# Patient Record
Sex: Female | Born: 1990
Health system: Southern US, Community
[De-identification: ages and names within clinical notes are randomized; demographics above are authoritative.]

## PROBLEM LIST (undated history)

## (undated) DIAGNOSIS — G8929 Other chronic pain: Secondary | ICD-10-CM

## (undated) DIAGNOSIS — F32A Depression, unspecified: Secondary | ICD-10-CM

## (undated) DIAGNOSIS — F419 Anxiety disorder, unspecified: Secondary | ICD-10-CM

## (undated) DIAGNOSIS — M5416 Radiculopathy, lumbar region: Secondary | ICD-10-CM

## (undated) HISTORY — PX: WISDOM TOOTH EXTRACTION: SHX21

## (undated) HISTORY — DX: Radiculopathy, lumbar region: M54.16

## (undated) HISTORY — PX: TONSILLECTOMY: SUR1361

---

## 2013-05-27 DIAGNOSIS — R000 Tachycardia, unspecified: Secondary | ICD-10-CM | POA: Insufficient documentation

## 2016-09-29 ENCOUNTER — Encounter: Payer: Self-pay | Admitting: Emergency Medicine

## 2016-09-29 ENCOUNTER — Emergency Department
Admission: EM | Admit: 2016-09-29 | Discharge: 2016-09-29 | Disposition: A | Discharge status: Home / Self Care | Payer: 59 | Attending: Emergency Medicine | Admitting: Emergency Medicine

## 2016-09-29 DIAGNOSIS — Z79899 Other long term (current) drug therapy: Secondary | ICD-10-CM | POA: Diagnosis not present

## 2016-09-29 DIAGNOSIS — K0889 Other specified disorders of teeth and supporting structures: Secondary | ICD-10-CM | POA: Diagnosis present

## 2016-09-29 MED ORDER — NAPROXEN 500 MG PO TABS
500.0000 mg | ORAL_TABLET | Freq: Once | ORAL | Status: AC
  Administered 2016-09-29: 500 mg via ORAL
  Filled 2016-09-29: qty 1

## 2016-09-29 MED ORDER — AMOXICILLIN 500 MG PO TABS: 500.0000 mg | ORAL_TABLET | Freq: Three times a day (TID) | ORAL | 0 refills | Status: AC

## 2016-09-29 MED ORDER — NAPROXEN 500 MG PO TBEC: 500.0000 mg | DELAYED_RELEASE_TABLET | Freq: Two times a day (BID) | ORAL | 0 refills | Status: DC

## 2016-09-29 NOTE — ED Notes (Signed)
See triage note  States she developed severe right jaw pain about 45 mins PTA  States developed pain about 1 hours after eating and while on the phone at work  States pain is at entire right jaw area

## 2016-09-29 NOTE — ED Triage Notes (Signed)
Pt c/o right jaw/dental pain. Thinks may have abscess but not sure. Pain started today. When takes breath in pain is worse when air hits mouth. Ambulatory to triage without distress. No fevers.

## 2016-09-29 NOTE — ED Provider Notes (Signed)
[facility] Emergency Department Provider Note  ____________________________________________  Time seen: Approximately 3:21 PM  I have reviewed the triage vital signs and the nursing notes.   HISTORY  Chief Complaint Jaw Pain    HPI Colleen Cook is a 26 y.o. female presenting to the emergency department with acute right lower jaw pain that started approximately 45 minutes ago. Patient states that she was on the phone at work, when she suddenly felt a sharp 10 out of 10 throbbing pain affecting her right lower jaw. Patient denies trauma to the right lower jaw. Patient is unable to identify a particular tooth as a source for her pain. Patient denies a history of dental abscesses. Patient denies otalgia and a history of TMJ pain. She has been afebrile. She has noticed no gingival hypertrophy, edema or fever. Patient states that she takes oxycodone daily for low back pain. No alleviating measures have been attempted. Patient denies chest pain, chest tightness, shortness of breath, abdominal pain, nausea and vomiting.    History reviewed. No pertinent past medical history.  There are no active problems to display for this patient.   Past Surgical History:  Procedure Laterality Date  . TONSILLECTOMY      Prior to Admission medications   Medication Sig Start Date End Date Taking? Authorizing Provider  hydrOXYzine (ATARAX/VISTARIL) 25 MG tablet Take 25 mg by mouth 3 (three) times daily as needed.   Yes Historical Provider, MD  oxyCODONE (OXY IR/ROXICODONE) 5 MG immediate release tablet Take 5 mg by mouth every 4 (four) hours as needed for severe pain.   Yes Historical Provider, MD  sertraline (ZOLOFT) 50 MG tablet Take 50 mg by mouth daily.   Yes Historical Provider, MD    Allergies Patient has no known allergies.  History reviewed. No pertinent family history.  Social History Social History  Substance Use Topics  . Smoking status: Never Smoker   . Smokeless tobacco: Never Used  . Alcohol use No     Review of Systems  Constitutional: No fever/chills Eyes: No visual changes. No discharge ENT: Patient has right lower jaw pain.  Cardiovascular: no chest pain. Respiratory: no cough. No SOB. Gastrointestinal: No abdominal pain. No nausea, no vomiting. No diarrhea.  No constipation. Musculoskeletal: Negative for musculoskeletal pain. Skin: Negative for rash, abrasions, lacerations, ecchymosis. Neurological: Negative for headaches, focal weakness or numbness.  ____________________________________________   PHYSICAL EXAM:  VITAL SIGNS: ED Triage Vitals [09/29/16 1418]  Enc Vitals Group     BP (!) 156/96     Pulse Rate (!) 110     Resp 20     Temp 98.2 F (36.8 C)     Temp Source Oral     SpO2 98 %     Weight 275 lb (124.7 kg)     Height 5\' 8"  (1.727 m)     Head Circumference      Peak Flow      Pain Score 7     Pain Loc      Pain Edu?      Excl. in GC?     Constitutional: Alert and oriented. Patient is talkative and engaged.  Eyes: Palpebral and bulbar conjunctiva are nonerythematous bilaterally. PERRL. EOMI. Head: Atraumatic. ENT:      Ears: Tympanic membranes are pearly bilaterally without effusion, erythema or purulent exudate. Bony landmarks are visualized bilaterally.       Mouth/Throat: Mucous membranes are moist. Posterior pharynx is nonerythematous. Patient has no dental caries visualized. There  is no increased gingival hypertrophy or edema. Neck: Full range of motion. No pain with neck flexion. Hematological/Lymphatic/Immunilogical: No cervical lymphadenopathy.  Cardiovascular: Mildly tachycardic. Normal S1 and S2. No murmurs, gallops or rubs auscultated.  Respiratory:  On auscultation, adventitious sounds are absent.  Neurologic:  Normal for age. No gross focal neurologic deficits are appreciated.  Musculoskeletal: No pain is elicited with TMJ testing. Skin:  Skin overlying the jaw is without edema or  erythema. Psychiatric: Mood and affect are normal for age. Speech and behavior are normal.   ____________________________________________   LABS (all labs ordered are listed, but only abnormal results are displayed)  Labs Reviewed - No data to display ____________________________________________  EKG   ____________________________________________  RADIOLOGY  No results found.  ____________________________________________    PROCEDURES  Procedure(s) performed:    Procedures    Medications - No data to display   ____________________________________________   INITIAL IMPRESSION / ASSESSMENT AND PLAN / ED COURSE  Pertinent labs & imaging results that were available during my care of the patient were reviewed by me and considered in my medical decision making (see chart for details).  Review of the [place] CSRS was performed in accordance of the [facility] prior to dispensing any controlled drugs.     Assessment and Plan: Pain, dental Patient presents to the emergency department with acute right lower jaw pain. Physical exam is reassuring with no signs of focal edema, erythema or gingival hypertrophy. No dental caries were visualized. Patient was started empirically on amoxicillin for an early dental abscess. Patient was advised to seek care with a dentist immediately. Patient was discharged with naproxen. All patient questions were answered.   ____________________________________________  FINAL CLINICAL IMPRESSION(S) / ED DIAGNOSES  Final diagnoses:  None      NEW MEDICATIONS STARTED DURING THIS VISIT:  New Prescriptions   No medications on file        This chart was dictated using voice recognition software/Dragon. Despite best efforts to proofread, errors can occur which can change the meaning. Any change was purely unintentional.    Orvil Feil, PA-C 09/29/16 2353    Phineas Semen, MD 09/30/16 [phone]

## 2017-02-26 ENCOUNTER — Telehealth: Payer: Self-pay

## 2017-02-26 NOTE — Telephone Encounter (Signed)
Patient is requesting to Establish Care. She is currently taking Sertraline, Clonazepam and Ibuprofen. She has a medical history of depression, anxiety, lumbar neuropathy and a bulging disc. She has [employer] for insurance. Please advise.  ZO#[phone]

## 2017-03-03 NOTE — Telephone Encounter (Signed)
Left patient a message to call back to schedule a new patient appointment with Ricki Rodriguezdriana if still interested. Okay per Ricki RodriguezAdriana.

## 2017-03-31 ENCOUNTER — Ambulatory Visit (INDEPENDENT_AMBULATORY_CARE_PROVIDER_SITE_OTHER): Payer: 59 | Admitting: Physician Assistant

## 2017-03-31 ENCOUNTER — Encounter: Payer: Self-pay | Admitting: Physician Assistant

## 2017-03-31 VITALS — BP 118/68 | HR 92 | Temp 98.4°F | Resp 16 | Ht 68.0 in | Wt 272.0 lb

## 2017-03-31 DIAGNOSIS — M5416 Radiculopathy, lumbar region: Secondary | ICD-10-CM

## 2017-03-31 DIAGNOSIS — Z7689 Persons encountering health services in other specified circumstances: Secondary | ICD-10-CM | POA: Diagnosis not present

## 2017-03-31 NOTE — Progress Notes (Signed)
Patient: Colleen Cook Female    DOB: 07/17/1991   26 y.o.   MRN: [id number] Visit Date: 03/31/2017  Today's Provider: Trey Sailors, PA-C   Chief Complaint  Patient presents with  . Establish Care    Recently moved from [facility].   . Back Pain    Needs referral    Subjective:      Establishing care today. Moved from [place] to [place]. Used to see Dr. Luis Abed at [facility].   Has history of lumbar radiculopathy, previously treated at [facility] with epidural steroid injections. Also previously on oxycodone. Would like to be seen closer to [place]. Previously did eight months PT and continues to do exercises at home.    Back Pain  This is a chronic problem. The current episode started more than 1 year ago. The problem occurs constantly. The problem has been gradually worsening since onset. The pain is present in the lumbar spine. The pain radiates to the left thigh. The pain is at a severity of 8/10. The pain is the same all the time. The symptoms are aggravated by position, standing and sitting. Pertinent negatives include no abdominal pain, bladder incontinence, bowel incontinence, chest pain, dysuria, headaches, leg pain, numbness, paresis, paresthesias, pelvic pain, perianal numbness, tingling, weakness or weight loss.    No Known Allergies   Current Outpatient Prescriptions:  .  ibuprofen (ADVIL,MOTRIN) 800 MG tablet, , Disp: , Rfl:  .  sertraline (ZOLOFT) 50 MG tablet, Take 50 mg by mouth daily., Disp: , Rfl:  .  clonazePAM (KLONOPIN) 0.5 MG tablet, Take 0.5 mg by mouth 2 (two) times daily as needed., Disp: , Rfl: 1 .  oxyCODONE (OXY IR/ROXICODONE) 5 MG immediate release tablet, Take 5 mg by mouth every 4 (four) hours as needed for severe pain., Disp: , Rfl:   Review of Systems  Constitutional: Negative.  Negative for weight loss.  HENT: Negative.   Eyes: Negative.   Respiratory: Negative.     Cardiovascular: Negative.  Negative for chest pain.  Gastrointestinal: Negative.  Negative for abdominal pain and bowel incontinence.  Endocrine: Negative.   Genitourinary: Positive for vaginal pain (Random "sharp pain" that lasts about 30 mins. ). Negative for bladder incontinence, decreased urine volume, difficulty urinating, dyspareunia, dysuria, enuresis, flank pain, frequency, genital sores, hematuria, menstrual problem, pelvic pain, urgency, vaginal bleeding and vaginal discharge.  Musculoskeletal: Positive for back pain.  Skin: Negative.   Allergic/Immunologic: Negative.   Neurological: Negative.  Negative for tingling, weakness, numbness, headaches and paresthesias.  Hematological: Negative.   Psychiatric/Behavioral: Negative for agitation, behavioral problems, confusion, decreased concentration, dysphoric mood, hallucinations, self-injury, sleep disturbance and suicidal ideas. The patient is nervous/anxious. The patient is not hyperactive.     Social History  Substance Use Topics  . Smoking status: Never Smoker  . Smokeless tobacco: Never Used  . Alcohol use No   Objective:   BP 118/68 (BP Location: Right Arm, Patient Position: Sitting, Cuff Size: Normal)   Pulse 92   Temp 98.4 F (36.9 C) (Oral)   Resp 16   Ht 5\' 8"  (1.727 m)   Wt 272 lb (123.4 kg)   BMI 41.36 kg/m  Vitals:   03/31/17 1013  BP: 118/68  Pulse: 92  Resp: 16  Temp: 98.4 F (36.9 C)  TempSrc: Oral  Weight: 272 lb (123.4 kg)  Height: 5\' 8"  (1.727 m)     Physical Exam  Constitutional: She is oriented  to person, place, and time. She appears well-developed and well-nourished.  Cardiovascular: Normal rate and regular rhythm.   Pulmonary/Chest: Effort normal and breath sounds normal.  Abdominal: Soft. Bowel sounds are normal.  Neurological: She is alert and oriented to person, place, and time.  Skin: Skin is warm and dry.  Psychiatric: She has a normal mood and affect. Her behavior is normal.         Assessment & Plan:     1. Encounter to establish care  Will need to request most recent PAP, vaccinations, treatment notes from prior PCP. Do see visits in [facility] with [facility] with injections. Will refer to Dr. Cherylann Ratel in pain clinic for further injections. Can refer back to [facility] if patient changes her mind.  - Ambulatory referral to Pain Clinic  Return in about 4 weeks (around 04/28/2017) for CPE.  The entirety of the information documented in the History of Present Illness, Review of Systems and Physical Exam were personally obtained by me. Portions of this information were initially documented by Kavin Leech, CMA and reviewed by me for thoroughness and accuracy.        Trey Sailors, PA-C  [facility]

## 2017-03-31 NOTE — Patient Instructions (Signed)
Sciatica Sciatica is pain, numbness, weakness, or tingling along your sciatic nerve. The sciatic nerve starts in the lower back and goes down the back of each leg. Sciatica happens when this nerve is pinched or has pressure put on it. Sciatica usually goes away on its own or with treatment. Sometimes, sciatica may keep coming back (recur). Follow these instructions at home: Medicines  Take over-the-counter and prescription medicines only as told by your doctor.  Do not drive or use heavy machinery while taking prescription pain medicine. Managing pain  If directed, put ice on the affected area. ? Put ice in a plastic bag. ? Place a towel between your skin and the bag. ? Leave the ice on for 20 minutes, 2-3 times a day.  After icing, apply heat to the affected area before you exercise or as often as told by your doctor. Use the heat source that your doctor tells you to use, such as a moist heat pack or a heating pad. ? Place a towel between your skin and the heat source. ? Leave the heat on for 20-30 minutes. ? Remove the heat if your skin turns bright red. This is especially important if you are unable to feel pain, heat, or cold. You may have a greater risk of getting burned. Activity  Return to your normal activities as told by your doctor. Ask your doctor what activities are safe for you. ? Avoid activities that make your sciatica worse.  Take short rests during the day. Rest in a lying or standing position. This is usually better than sitting to rest. ? When you rest for a long time, do some physical activity or stretching between periods of rest. ? Avoid sitting for a long time without moving. Get up and move around at least one time each hour.  Exercise and stretch regularly, as told by your doctor.  Do not lift anything that is heavier than 10 lb (4.5 kg) while you have symptoms of sciatica. ? Avoid lifting heavy things even when you do not have symptoms. ? Avoid lifting heavy  things over and over.  When you lift objects, always lift in a way that is safe for your body. To do this, you should: ? Bend your knees. ? Keep the object close to your body. ? Avoid twisting. General instructions  Use good posture. ? Avoid leaning forward when you are sitting. ? Avoid hunching over when you are standing.  Stay at a healthy weight.  Wear comfortable shoes that support your feet. Avoid wearing high heels.  Avoid sleeping on a mattress that is too soft or too hard. You might have less pain if you sleep on a mattress that is firm enough to support your back.  Keep all follow-up visits as told by your doctor. This is important. Contact a doctor if:  You have pain that: ? Wakes you up when you are sleeping. ? Gets worse when you lie down. ? Is worse than the pain you have had in the past. ? Lasts longer than 4 weeks.  You lose weight for without trying. Get help right away if:  You cannot control when you pee (urinate) or poop (have a bowel movement).  You have weakness in any of these areas and it gets worse. ? Lower back. ? Lower belly (pelvis). ? Butt (buttocks). ? Legs.  You have redness or swelling of your back.  You have a burning feeling when you pee. This information is not intended to replace   advice given to you by your health care provider. Make sure you discuss any questions you have with your health care provider. Document Released: 05/06/2008 Document Revised: 01/03/2016 Document Reviewed: 04/06/2015 Elsevier Interactive Patient Education  2018 [employer]  

## 2017-04-14 ENCOUNTER — Ambulatory Visit
Payer: 59 | Attending: Student in an Organized Health Care Education/Training Program | Admitting: Student in an Organized Health Care Education/Training Program

## 2017-04-14 ENCOUNTER — Encounter: Payer: Self-pay | Admitting: Student in an Organized Health Care Education/Training Program

## 2017-04-14 VITALS — BP 160/98 | HR 110 | Temp 98.9°F | Resp 18 | Ht 68.0 in | Wt 265.0 lb

## 2017-04-14 DIAGNOSIS — Z79899 Other long term (current) drug therapy: Secondary | ICD-10-CM | POA: Diagnosis not present

## 2017-04-14 DIAGNOSIS — F4310 Post-traumatic stress disorder, unspecified: Secondary | ICD-10-CM | POA: Insufficient documentation

## 2017-04-14 DIAGNOSIS — M5416 Radiculopathy, lumbar region: Secondary | ICD-10-CM | POA: Diagnosis not present

## 2017-04-14 DIAGNOSIS — F909 Attention-deficit hyperactivity disorder, unspecified type: Secondary | ICD-10-CM | POA: Diagnosis not present

## 2017-04-14 DIAGNOSIS — F410 Panic disorder [episodic paroxysmal anxiety] without agoraphobia: Secondary | ICD-10-CM | POA: Diagnosis not present

## 2017-04-14 DIAGNOSIS — Z6841 Body Mass Index (BMI) 40.0 and over, adult: Secondary | ICD-10-CM | POA: Diagnosis not present

## 2017-04-14 DIAGNOSIS — M4726 Other spondylosis with radiculopathy, lumbar region: Secondary | ICD-10-CM | POA: Diagnosis not present

## 2017-04-14 DIAGNOSIS — F411 Generalized anxiety disorder: Secondary | ICD-10-CM | POA: Diagnosis not present

## 2017-04-14 DIAGNOSIS — F331 Major depressive disorder, recurrent, moderate: Secondary | ICD-10-CM | POA: Insufficient documentation

## 2017-04-14 DIAGNOSIS — G894 Chronic pain syndrome: Secondary | ICD-10-CM | POA: Insufficient documentation

## 2017-04-14 DIAGNOSIS — M47816 Spondylosis without myelopathy or radiculopathy, lumbar region: Secondary | ICD-10-CM | POA: Diagnosis not present

## 2017-04-14 MED ORDER — TIZANIDINE HCL 4 MG PO TABS: 4.0000 mg | ORAL_TABLET | Freq: Four times a day (QID) | ORAL | 0 refills | Status: DC | PRN

## 2017-04-14 MED ORDER — TOPIRAMATE 25 MG PO TABS: 25.0000 mg | ORAL_TABLET | Freq: Two times a day (BID) | ORAL | 1 refills | Status: DC

## 2017-04-14 NOTE — Patient Instructions (Addendum)
It was nice meeting you today. Today we discussed the following:  1. Urine drug screen today. 2. Start Topamax 25 mg twice a day. 3. Tizanidine 4 mg up to 3 times a day as needed for muscle spasms 4. Schedule lumbar epidural steroid injection 5. As we discussed, free to be considered for opioid therapy at our clinic, you must be off of benzodiazepines such as Klonopin. Try using the tizanidine which may also help out with anxiety. Have a discussion about this with her psychiatrist. Follow-up with me in approximately 3 weeks for medication management, next available for epidural steroid injection.Pain Management Discharge Instructions  General Discharge Instructions :  If you need to reach your doctor call: Monday-Friday 8:00 am - 4:00 pm at [phone] or toll free [phone].  After clinic hours [phone] to have operator reach doctor.  Bring all of your medication bottles to all your appointments in the pain clinic.  To cancel or reschedule your appointment with Pain Management please remember to call 24 hours in advance to avoid a fee.  Refer to the educational materials which you have been given on: General Risks, I had my Procedure. Discharge Instructions, Post Sedation.  Post Procedure Instructions:  The drugs you were given will stay in your system until tomorrow, so for the next 24 hours you should not drive, make any legal decisions or drink any alcoholic beverages.  You may eat anything you prefer, but it is better to start with liquids then soups and crackers, and gradually work up to solid foods.  Please notify your doctor immediately if you have any unusual bleeding, trouble breathing or pain that is not related to your normal pain.  Depending on the type of procedure that was done, some parts of your body may feel week and/or numb.  This usually clears up by tonight or the next day.  Walk with the use of an assistive device or accompanied by an adult for the 24  hours.  You may use ice on the affected area for the first 24 hours.  Put ice in a Ziploc bag and cover with a towel and place against area 15 minutes on 15 minutes off.  You may switch to heat after 24 hours.GENERAL RISKS AND COMPLICATIONS  What are the risk, side effects and possible complications? Generally speaking, most procedures are safe.  However, with any procedure there are risks, side effects, and the possibility of complications.  The risks and complications are dependent upon the sites that are lesioned, or the type of nerve block to be performed.  The closer the procedure is to the spine, the more serious the risks are.  Great care is taken when placing the radio frequency needles, block needles or lesioning probes, but sometimes complications can occur. 1. Infection: Any time there is an injection through the skin, there is a risk of infection.  This is why sterile conditions are used for these blocks.  There are four possible types of infection. 1. Localized skin infection. 2. Central Nervous System Infection-This can be in the form of Meningitis, which can be deadly. 3. Epidural Infections-This can be in the form of an epidural abscess, which can cause pressure inside of the spine, causing compression of the spinal cord with subsequent paralysis. This would require an emergency surgery to decompress, and there are no guarantees that the patient would recover from the paralysis. 4. Discitis-This is an infection of the intervertebral discs.  It occurs in about 1% of discography procedures.  It is difficult to treat and it may lead to surgery.        2. Pain: the needles have to go through skin and soft tissues, will cause soreness.       3. Damage to internal structures:  The nerves to be lesioned may be near blood vessels or    other nerves which can be potentially damaged.       4. Bleeding: Bleeding is more common if the patient is taking blood thinners such as  aspirin, Coumadin,  Ticiid, Plavix, etc., or if he/she have some genetic predisposition  such as hemophilia. Bleeding into the spinal canal can cause compression of the spinal  cord with subsequent paralysis.  This would require an emergency surgery to  decompress and there are no guarantees that the patient would recover from the  paralysis.       5. Pneumothorax:  Puncturing of a lung is a possibility, every time a needle is introduced in  the area of the chest or upper back.  Pneumothorax refers to free air around the  collapsed lung(s), inside of the thoracic cavity (chest cavity).  Another two possible  complications related to a similar event would include: Hemothorax and Chylothorax.   These are variations of the Pneumothorax, where instead of air around the collapsed  lung(s), you may have blood or chyle, respectively.       6. Spinal headaches: They may occur with any procedures in the area of the spine.       7. Persistent CSF (Cerebro-Spinal Fluid) leakage: This is a rare problem, but may occur  with prolonged intrathecal or epidural catheters either due to the formation of a fistulous  track or a dural tear.       8. Nerve damage: By working so close to the spinal cord, there is always a possibility of  nerve damage, which could be as serious as a permanent spinal cord injury with  paralysis.       9. Death:  Although rare, severe deadly allergic reactions known as "Anaphylactic  reaction" can occur to any of the medications used.      10. Worsening of the symptoms:  We can always make thing worse.  What are the chances of something like this happening? Chances of any of this occuring are extremely low.  By statistics, you have more of a chance of getting killed in a motor vehicle accident: while driving to the hospital than any of the above occurring .  Nevertheless, you should be aware that they are possibilities.  In general, it is similar to taking a shower.  Everybody knows that you can slip, hit your head and  get killed.  Does that mean that you should not shower again?  Nevertheless always keep in mind that statistics do not mean anything if you happen to be on the wrong side of them.  Even if a procedure has a 1 (one) in a 1,000,000 (million) chance of going wrong, it you happen to be that one..Also, keep in mind that by statistics, you have more of a chance of having something go wrong when taking medications.  Who should not have this procedure? If you are on a blood thinning medication (e.g. Coumadin, Plavix, see list of "Blood Thinners"), or if you have an active infection going on, you should not have the procedure.  If you are taking any blood thinners, please inform your physician.  How should I prepare for this procedure?  Do not eat or drink anything at least six hours prior to the procedure.  Bring a driver with you .  It cannot be a taxi.  Come accompanied by an adult that can drive you back, and that is strong enough to help you if your legs get weak or numb from the local anesthetic.  Take all of your medicines the morning of the procedure with just enough water to swallow them.  If you have diabetes, make sure that you are scheduled to have your procedure done first thing in the morning, whenever possible.  If you have diabetes, take only half of your insulin dose and notify our nurse that you have done so as soon as you arrive at the clinic.  If you are diabetic, but only take blood sugar pills (oral hypoglycemic), then do not take them on the morning of your procedure.  You may take them after you have had the procedure.  Do not take aspirin or any aspirin-containing medications, at least eleven (11) days prior to the procedure.  They may prolong bleeding.  Wear loose fitting clothing that may be easy to take off and that you would not mind if it got stained with Betadine or blood.  Do not wear any jewelry or perfume  Remove any nail coloring.  It will interfere with some of  our monitoring equipment.  NOTE: Remember that this is not meant to be interpreted as a complete list of all possible complications.  Unforeseen problems may occur.  BLOOD THINNERS The following drugs contain aspirin or other products, which can cause increased bleeding during surgery and should not be taken for 2 weeks prior to and 1 week after surgery.  If you should need take something for relief of minor pain, you may take acetaminophen which is found in Tylenol,m Datril, Anacin-3 and Panadol. It is not blood thinner. The products listed below are.  Do not take any of the products listed below in addition to any listed on your instruction sheet.  A.P.C or A.P.C with Codeine Codeine Phosphate Capsules #3 Ibuprofen Ridaura  ABC compound Congesprin Imuran rimadil  Advil Cope Indocin Robaxisal  Alka-Seltzer Effervescent Pain Reliever and Antacid Coricidin or Coricidin-D  Indomethacin Rufen  Alka-Seltzer plus Cold Medicine Cosprin Ketoprofen S-A-C Tablets  Anacin Analgesic Tablets or Capsules Coumadin Korlgesic Salflex  Anacin Extra Strength Analgesic tablets or capsules CP-2 Tablets Lanoril Salicylate  Anaprox Cuprimine Capsules Levenox Salocol  Anexsia-D Dalteparin Magan Salsalate  Anodynos Darvon compound Magnesium Salicylate Sine-off  Ansaid Dasin Capsules Magsal Sodium Salicylate  Anturane Depen Capsules Marnal Soma  APF Arthritis pain formula Dewitt's Pills Measurin Stanback  Argesic Dia-Gesic Meclofenamic Sulfinpyrazone  Arthritis Bayer Timed Release Aspirin Diclofenac Meclomen Sulindac  Arthritis pain formula Anacin Dicumarol Medipren Supac  Analgesic (Safety coated) Arthralgen Diffunasal Mefanamic Suprofen  Arthritis Strength Bufferin Dihydrocodeine Mepro Compound Suprol  Arthropan liquid Dopirydamole Methcarbomol with Aspirin Synalgos  ASA tablets/Enseals Disalcid Micrainin Tagament  Ascriptin Doan's Midol Talwin  Ascriptin A/D Dolene Mobidin Tanderil  Ascriptin Extra Strength  Dolobid Moblgesic Ticlid  Ascriptin with Codeine Doloprin or Doloprin with Codeine Momentum Tolectin  Asperbuf Duoprin Mono-gesic Trendar  Aspergum Duradyne Motrin or Motrin IB Triminicin  Aspirin plain, buffered or enteric coated Durasal Myochrisine Trigesic  Aspirin Suppositories Easprin Nalfon Trillsate  Aspirin with Codeine Ecotrin Regular or Extra Strength Naprosyn Uracel  Atromid-S Efficin Naproxen Ursinus  Auranofin Capsules Elmiron Neocylate Vanquish  Axotal Emagrin Norgesic Verin  Azathioprine Empirin or Empirin with Codeine Normiflo Vitamin E  Azolid Emprazil Nuprin Voltaren  Bayer Aspirin plain, buffered or children's or timed BC Tablets or powders Encaprin Orgaran Warfarin Sodium  Buff-a-Comp Enoxaparin Orudis Zorpin  Buff-a-Comp with Codeine Equegesic Os-Cal-Gesic   Buffaprin Excedrin plain, buffered or Extra Strength Oxalid   Bufferin Arthritis Strength Feldene Oxphenbutazone   Bufferin plain or Extra Strength Feldene Capsules Oxycodone with Aspirin   Bufferin with Codeine Fenoprofen Fenoprofen Pabalate or Pabalate-SF   Buffets II Flogesic Panagesic   Buffinol plain or Extra Strength Florinal or Florinal with Codeine Panwarfarin   Buf-Tabs Flurbiprofen Penicillamine   Butalbital Compound Four-way cold tablets Penicillin   Butazolidin Fragmin Pepto-Bismol   Carbenicillin Geminisyn Percodan   Carna Arthritis Reliever Geopen Persantine   Carprofen Gold's salt Persistin   Chloramphenicol Goody's Phenylbutazone   Chloromycetin Haltrain Piroxlcam   Clmetidine heparin Plaquenil   Cllnoril Hyco-pap Ponstel   Clofibrate Hydroxy chloroquine Propoxyphen         Before stopping any of these medications, be sure to consult the physician who ordered them.  Some, such as Coumadin (Warfarin) are ordered to prevent or treat serious conditions such as "deep thrombosis", "pumonary embolisms", and other heart problems.  The amount of time that you may need off of the medication may also  vary with the medication and the reason for which you were taking it.  If you are taking any of these medications, please make sure you notify your pain physician before you undergo any procedures.         Epidural Steroid Injection Patient Information  Description: The epidural space surrounds the nerves as they exit the spinal cord.  In some patients, the nerves can be compressed and inflamed by a bulging disc or a tight spinal canal (spinal stenosis).  By injecting steroids into the epidural space, we can bring irritated nerves into direct contact with a potentially helpful medication.  These steroids act directly on the irritated nerves and can reduce swelling and inflammation which often leads to decreased pain.  Epidural steroids may be injected anywhere along the spine and from the neck to the low back depending upon the location of your pain.   After numbing the skin with local anesthetic (like Novocaine), a small needle is passed into the epidural space slowly.  You may experience a sensation of pressure while this is being done.  The entire block usually last less than 10 minutes.  Conditions which may be treated by epidural steroids:   Low back and leg pain  Neck and arm pain  Spinal stenosis  Post-laminectomy syndrome  Herpes zoster (shingles) pain  Pain from compression fractures  Preparation for the injection:  1. Do not eat any solid food or dairy products within 8 hours of your appointment.  2. You may drink clear liquids up to 3 hours before appointment.  Clear liquids include water, black coffee, juice or soda.  No milk or cream please. 3. You may take your regular medication, including pain medications, with a sip of water before your appointment  Diabetics should hold regular insulin (if taken separately) and take 1/2 normal NPH dos the morning of the procedure.  Carry some sugar containing items with you to your appointment. 4. A driver must accompany you and  be prepared to drive you home after your procedure.  5. Bring all your current medications with your. 6. An IV may be inserted and sedation may be given at the discretion of the physician.   7. A blood pressure cuff, EKG and other  monitors will often be applied during the procedure.  Some patients may need to have extra oxygen administered for a short period. 8. You will be asked to provide medical information, including your allergies, prior to the procedure.  We must know immediately if you are taking blood thinners (like Coumadin/Warfarin)  Or if you are allergic to IV iodine contrast (dye). We must know if you could possible be pregnant.  Possible side-effects:  Bleeding from needle site  Infection (rare, may require surgery)  Nerve injury (rare)  Numbness & tingling (temporary)  Difficulty urinating (rare, temporary)  Spinal headache ( a headache worse with upright posture)  Light -headedness (temporary)  Pain at injection site (several days)  Decreased blood pressure (temporary)  Weakness in arm/leg (temporary)  Pressure sensation in back/neck (temporary)  Call if you experience:  Fever/chills associated with headache or increased back/neck pain.  Headache worsened by an upright position.  New onset weakness or numbness of an extremity below the injection site  Hives or difficulty breathing (go to the emergency room)  Inflammation or drainage at the infection site  Severe back/neck pain  Any new symptoms which are concerning to you  Please note:  Although the local anesthetic injected can often make your back or neck feel good for several hours after the injection, the pain will likely return.  It takes 3-7 days for steroids to work in the epidural space.  You may not notice any pain relief for at least that one week.  If effective, we will often do a series of three injections spaced 3-6 weeks apart to maximally decrease your pain.  After the initial series,  we generally will wait several months before considering a repeat injection of the same type.  If you have any questions, please call [phone] [facility]

## 2017-04-14 NOTE — Progress Notes (Signed)
Safety precautions to be maintained throughout the outpatient stay will include: orient to surroundings, keep bed in low position, maintain call bell within reach at all times, provide assistance with transfer out of bed and ambulation.  

## 2017-04-14 NOTE — Progress Notes (Signed)
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  . tiZANidine (ZANAFLEX) 4 MG tablet    Sig: Take 1 tablet (4 mg total) by mouth every 6 (six) hours as needed for muscle spasms.    Dispense:  30 tablet    Refill:  0   Medications administered during this visit: Ms. Fesler had no medications administered during this visit.   Pharmacological management options:  Opioid Analgesics: The patient was informed that there is no guarantee that she would be a candidate for opioid analgesics. The decision will be made following CDC guidelines. This decision will be based on the results of diagnostic studies, as well as Ms. Tsou's risk profile. Can consider Percocet 5 mg, quantity 60-90. Can also consider hydrocodone 7.5 mg   Membrane stabilizer: To be determined at a later time has tried gabapentin resulted in sedation. We'll trial Topamax today. Can consider Lyrica in the future   Muscle relaxant: To be determined at a later time we'll trial tizanidine today, can consider baclofen and Robaxin in the future. Tried Flexeril in the past.   NSAID: To be determined  at a later time trial of Mobic or diclofenac in the future.   Other analgesic(s): To be determined at a later time Voltaren gel, TENS, any PSYCHOTROPIC AGENTS per [facility]    Interventional management options: Ms. Siebers was informed that there is no guarantee that she would  be a candidate for interventional therapies. The decision will be based on the results of diagnostic studies, as well as Ms. Erck's risk profile.  Procedure(s) under consideration:  -Lumbar epidural steroid injection -Lumbar medial branch blocks with goal radiofrequency ablation (LEFT L3-L5) -Sacroiliac joint injection (left)   Provider-requested follow-up: Return in about 3 weeks (around 05/05/2017) for Procedure, Medication Management.  Future Appointments Date Time Provider [place]  05/05/2017 9:30 AM Gillis Santa, MD [facility] None    Primary Care Physician: Paulene Floor Location: [facility] Facility Note by: Gillis Santa, M.D, Date: 04/14/2017; Time: 11:05 AM  Patient Instructions   It was nice meeting you today. Today we discussed the following:  1. Urine drug screen today. 2. Start Topamax 25 mg twice a day. 3. Tizanidine 4 mg up to 3 times a day as needed for muscle spasms 4. Schedule lumbar epidural steroid injection 5. As we discussed, free to be considered for opioid therapy at our clinic, you must be off of benzodiazepines such as Klonopin. Try using the tizanidine which may also help out with anxiety. Have a discussion about this with her psychiatrist. Follow-up with me in approximately 3 weeks for medication management, next available for epidural steroid injection.Pain Management Discharge Instructions  General Discharge Instructions :  If you need to reach your doctor call: Monday-Friday 8:00 am - 4:00 pm at [phone] or toll free [phone].  After clinic hours [phone] to have operator reach doctor.  Bring all of your medication bottles  to all your appointments in the pain clinic.  To cancel or reschedule your appointment with Pain Management please remember to call 24 hours in advance to avoid a fee.  Refer to the educational materials which you have been given on: General Risks, I had my Procedure. Discharge Instructions, Post Sedation.  Post Procedure Instructions:  The drugs you were given will stay in your system until tomorrow, so for the next 24 hours you should not drive, make any legal decisions or drink any alcoholic beverages.  You may eat anything you prefer, but it is better to start with liquids then soups and crackers, and gradually work up to solid foods.  Please notify your doctor immediately if you have any unusual bleeding, trouble breathing or pain that is not related to your normal pain.  Depending on the type of procedure that was done, some parts of your body may feel week and/or numb.  This usually clears up by tonight or the next day.  Walk with the use of an assistive device or accompanied by an adult for the 24 hours.  You may use ice on the affected area for the first 24 hours.  Put ice in a Ziploc bag and cover with a towel and place against area 15 minutes on 15 minutes off.  You may switch to heat after 24 hours.GENERAL RISKS AND COMPLICATIONS  What are the risk, side effects and possible complications? Generally speaking, most procedures are safe.  However, with any procedure there are risks, side effects, and the possibility of complications.  The risks and complications are dependent upon the sites that are lesioned, or the type of nerve block to be performed.  The closer the procedure is to the spine, the more serious the risks are.  Great care is taken when placing the radio frequency needles, block needles or lesioning probes, but sometimes complications can occur. 1. Infection: Any time there is an injection through the skin, there is a risk  of infection.  This is why sterile conditions  are used for these blocks.  There are four possible types of infection. 1. Localized skin infection. 2. Central Nervous System Infection-This can be in the form of Meningitis, which can be deadly. 3. Epidural Infections-This can be in the form of an epidural abscess, which can cause pressure inside of the spine, causing compression of the spinal cord with subsequent paralysis. This would require an emergency surgery to decompress, and there are no guarantees that the patient would recover from the paralysis. 4. Discitis-This is an infection of the intervertebral discs.  It occurs in about 1% of discography procedures.  It is difficult to treat and it may lead to surgery.        2. Pain: the needles have to go through skin and soft tissues, will cause soreness.       3. Damage to internal structures:  The nerves to be lesioned may be near blood vessels or    other nerves which can be potentially damaged.       4. Bleeding: Bleeding is more common if the patient is taking blood thinners such as  aspirin, Coumadin, Ticiid, Plavix, etc., or if he/she have some genetic predisposition  such as hemophilia. Bleeding into the spinal canal can cause compression of the spinal  cord with subsequent paralysis.  This would require an emergency surgery to  decompress and there are no guarantees that the patient would recover from the  paralysis.       5. Pneumothorax:  Puncturing of a lung is a possibility, every time a needle is introduced in  the area of the chest or upper back.  Pneumothorax refers to free air around the  collapsed lung(s), inside of the thoracic cavity (chest cavity).  Another two possible  complications related to a similar event would include: Hemothorax and Chylothorax.   These are variations of the Pneumothorax, where instead of air around the collapsed  lung(s), you may have blood or chyle, respectively.       6. Spinal headaches: They may occur with any procedures in the area of the spine.        7. Persistent CSF (Cerebro-Spinal Fluid) leakage: This is a rare problem, but may occur  with prolonged intrathecal or epidural catheters either due to the formation of a fistulous  track or a dural tear.       8. Nerve damage: By working so close to the spinal cord, there is always a possibility of  nerve damage, which could be as serious as a permanent spinal cord injury with  paralysis.       9. Death:  Although rare, severe deadly allergic reactions known as "Anaphylactic  reaction" can occur to any of the medications used.      10. Worsening of the symptoms:  We can always make thing worse.  What are the chances of something like this happening? Chances of any of this occuring are extremely low.  By statistics, you have more of a chance of getting killed in a motor vehicle accident: while driving to the hospital than any of the above occurring .  Nevertheless, you should be aware that they are possibilities.  In general, it is similar to taking a shower.  Everybody knows that you can slip, hit your head and get killed.  Does that mean that you should not shower again?  Nevertheless always keep in mind that statistics do not mean anything if you happen to  be on the wrong side of them.  Even if a procedure has a 1 (one) in a 1,000,000 (million) chance of going wrong, it you happen to be that one..Also, keep in mind that by statistics, you have more of a chance of having something go wrong when taking medications.  Who should not have this procedure? If you are on a blood thinning medication (e.g. Coumadin, Plavix, see list of "Blood Thinners"), or if you have an active infection going on, you should not have the procedure.  If you are taking any blood thinners, please inform your physician.  How should I prepare for this procedure?  Do not eat or drink anything at least six hours prior to the procedure.  Bring a driver with you .  It cannot be a taxi.  Come accompanied by an adult that can  drive you back, and that is strong enough to help you if your legs get weak or numb from the local anesthetic.  Take all of your medicines the morning of the procedure with just enough water to swallow them.  If you have diabetes, make sure that you are scheduled to have your procedure done first thing in the morning, whenever possible.  If you have diabetes, take only half of your insulin dose and notify our nurse that you have done so as soon as you arrive at the clinic.  If you are diabetic, but only take blood sugar pills (oral hypoglycemic), then do not take them on the morning of your procedure.  You may take them after you have had the procedure.  Do not take aspirin or any aspirin-containing medications, at least eleven (11) days prior to the procedure.  They may prolong bleeding.  Wear loose fitting clothing that may be easy to take off and that you would not mind if it got stained with Betadine or blood.  Do not wear any jewelry or perfume  Remove any nail coloring.  It will interfere with some of our monitoring equipment.  NOTE: Remember that this is not meant to be interpreted as a complete list of all possible complications.  Unforeseen problems may occur.  BLOOD THINNERS The following drugs contain aspirin or other products, which can cause increased bleeding during surgery and should not be taken for 2 weeks prior to and 1 week after surgery.  If you should need take something for relief of minor pain, you may take acetaminophen which is found in Tylenol,m Datril, Anacin-3 and Panadol. It is not blood thinner. The products listed below are.  Do not take any of the products listed below in addition to any listed on your instruction sheet.  A.P.C or A.P.C with Codeine Codeine Phosphate Capsules #3 Ibuprofen Ridaura  ABC compound Congesprin Imuran rimadil  Advil Cope Indocin Robaxisal  Alka-Seltzer Effervescent Pain Reliever and Antacid Coricidin or Coricidin-D  Indomethacin  Rufen  Alka-Seltzer plus Cold Medicine Cosprin Ketoprofen S-A-C Tablets  Anacin Analgesic Tablets or Capsules Coumadin Korlgesic Salflex  Anacin Extra Strength Analgesic tablets or capsules CP-2 Tablets Lanoril Salicylate  Anaprox Cuprimine Capsules Levenox Salocol  Anexsia-D Dalteparin Magan Salsalate  Anodynos Darvon compound Magnesium Salicylate Sine-off  Ansaid Dasin Capsules Magsal Sodium Salicylate  Anturane Depen Capsules Marnal Soma  APF Arthritis pain formula Dewitt's Pills Measurin Stanback  Argesic Dia-Gesic Meclofenamic Sulfinpyrazone  Arthritis Bayer Timed Release Aspirin Diclofenac Meclomen Sulindac  Arthritis pain formula Anacin Dicumarol Medipren Supac  Analgesic (Safety coated) Arthralgen Diffunasal Mefanamic Suprofen  Arthritis Strength Bufferin Dihydrocodeine Mepro Compound  Suprol  Arthropan liquid Dopirydamole Methcarbomol with Aspirin Synalgos  ASA tablets/Enseals Disalcid Micrainin Tagament  Ascriptin Doan's Midol Talwin  Ascriptin A/D Dolene Mobidin Tanderil  Ascriptin Extra Strength Dolobid Moblgesic Ticlid  Ascriptin with Codeine Doloprin or Doloprin with Codeine Momentum Tolectin  Asperbuf Duoprin Mono-gesic Trendar  Aspergum Duradyne Motrin or Motrin IB Triminicin  Aspirin plain, buffered or enteric coated Durasal Myochrisine Trigesic  Aspirin Suppositories Easprin Nalfon Trillsate  Aspirin with Codeine Ecotrin Regular or Extra Strength Naprosyn Uracel  Atromid-S Efficin Naproxen Ursinus  Auranofin Capsules Elmiron Neocylate Vanquish  Axotal Emagrin Norgesic Verin  Azathioprine Empirin or Empirin with Codeine Normiflo Vitamin E  Azolid Emprazil Nuprin Voltaren  Bayer Aspirin plain, buffered or children's or timed BC Tablets or powders Encaprin Orgaran Warfarin Sodium  Buff-a-Comp Enoxaparin Orudis Zorpin  Buff-a-Comp with Codeine Equegesic Os-Cal-Gesic   Buffaprin Excedrin plain, buffered or Extra Strength Oxalid   Bufferin Arthritis Strength Feldene  Oxphenbutazone   Bufferin plain or Extra Strength Feldene Capsules Oxycodone with Aspirin   Bufferin with Codeine Fenoprofen Fenoprofen Pabalate or Pabalate-SF   Buffets II Flogesic Panagesic   Buffinol plain or Extra Strength Florinal or Florinal with Codeine Panwarfarin   Buf-Tabs Flurbiprofen Penicillamine   Butalbital Compound Four-way cold tablets Penicillin   Butazolidin Fragmin Pepto-Bismol   Carbenicillin Geminisyn Percodan   Carna Arthritis Reliever Geopen Persantine   Carprofen Gold's salt Persistin   Chloramphenicol Goody's Phenylbutazone   Chloromycetin Haltrain Piroxlcam   Clmetidine heparin Plaquenil   Cllnoril Hyco-pap Ponstel   Clofibrate Hydroxy chloroquine Propoxyphen         Before stopping any of these medications, be sure to consult the physician who ordered them.  Some, such as Coumadin (Warfarin) are ordered to prevent or treat serious conditions such as "deep thrombosis", "pumonary embolisms", and other heart problems.  The amount of time that you may need off of the medication may also vary with the medication and the reason for which you were taking it.  If you are taking any of these medications, please make sure you notify your pain physician before you undergo any procedures.         Epidural Steroid Injection Patient Information  Description: The epidural space surrounds the nerves as they exit the spinal cord.  In some patients, the nerves can be compressed and inflamed by a bulging disc or a tight spinal canal (spinal stenosis).  By injecting steroids into the epidural space, we can bring irritated nerves into direct contact with a potentially helpful medication.  These steroids act directly on the irritated nerves and can reduce swelling and inflammation which often leads to decreased pain.  Epidural steroids may be injected anywhere along the spine and from the neck to the low back depending upon the location of your pain.   After numbing the skin with  local anesthetic (like Novocaine), a small needle is passed into the epidural space slowly.  You may experience a sensation of pressure while this is being done.  The entire block usually last less than 10 minutes.  Conditions which may be treated by epidural steroids:   Low back and leg pain  Neck and arm pain  Spinal stenosis  Post-laminectomy syndrome  Herpes zoster (shingles) pain  Pain from compression fractures  Preparation for the injection:  1. Do not eat any solid food or dairy products within 8 hours of your appointment.  2. You may drink clear liquids up to 3 hours before appointment.  Clear liquids include water, black  coffee, juice or soda.  No milk or cream please. 3. You may take your regular medication, including pain medications, with a sip of water before your appointment  Diabetics should hold regular insulin (if taken separately) and take 1/2 normal NPH dos the morning of the procedure.  Carry some sugar containing items with you to your appointment. 4. A driver must accompany you and be prepared to drive you home after your procedure.  5. Bring all your current medications with your. 6. An IV may be inserted and sedation may be given at the discretion of the physician.   7. A blood pressure cuff, EKG and other monitors will often be applied during the procedure.  Some patients may need to have extra oxygen administered for a short period. 8. You will be asked to provide medical information, including your allergies, prior to the procedure.  We must know immediately if you are taking blood thinners (like Coumadin/Warfarin)  Or if you are allergic to IV iodine contrast (dye). We must know if you could possible be pregnant.  Possible side-effects:  Bleeding from needle site  Infection (rare, may require surgery)  Nerve injury (rare)  Numbness & tingling (temporary)  Difficulty urinating (rare, temporary)  Spinal headache ( a headache worse with upright  posture)  Light -headedness (temporary)  Pain at injection site (several days)  Decreased blood pressure (temporary)  Weakness in arm/leg (temporary)  Pressure sensation in back/neck (temporary)  Call if you experience:  Fever/chills associated with headache or increased back/neck pain.  Headache worsened by an upright position.  New onset weakness or numbness of an extremity below the injection site  Hives or difficulty breathing (go to the emergency room)  Inflammation or drainage at the infection site  Severe back/neck pain  Any new symptoms which are concerning to you  Please note:  Although the local anesthetic injected can often make your back or neck feel good for several hours after the injection, the pain will likely return.  It takes 3-7 days for steroids to work in the epidural space.  You may not notice any pain relief for at least that one week.  If effective, we will often do a series of three injections spaced 3-6 weeks apart to maximally decrease your pain.  After the initial series, we generally will wait several months before considering a repeat injection of the same type.  If you have any questions, please call [phone] [facility]

## 2017-04-20 LAB — COMPLIANCE DRUG ANALYSIS, UR

## 2017-05-04 ENCOUNTER — Ambulatory Visit (HOSPITAL_BASED_OUTPATIENT_CLINIC_OR_DEPARTMENT_OTHER): Payer: 59 | Admitting: Student in an Organized Health Care Education/Training Program

## 2017-05-04 ENCOUNTER — Encounter: Payer: Self-pay | Admitting: Student in an Organized Health Care Education/Training Program

## 2017-05-04 ENCOUNTER — Ambulatory Visit
Admission: RE | Admit: 2017-05-04 | Discharge: 2017-05-04 | Disposition: A | Discharge status: Home / Self Care | Payer: 59 | Source: Ambulatory Visit | Attending: Student in an Organized Health Care Education/Training Program | Admitting: Student in an Organized Health Care Education/Training Program

## 2017-05-04 VITALS — BP 140/88 | HR 69 | Temp 97.7°F | Resp 19 | Ht 68.0 in | Wt 265.0 lb

## 2017-05-04 DIAGNOSIS — Z79891 Long term (current) use of opiate analgesic: Secondary | ICD-10-CM | POA: Diagnosis not present

## 2017-05-04 DIAGNOSIS — F4310 Post-traumatic stress disorder, unspecified: Secondary | ICD-10-CM | POA: Insufficient documentation

## 2017-05-04 DIAGNOSIS — Z79899 Other long term (current) drug therapy: Secondary | ICD-10-CM | POA: Diagnosis not present

## 2017-05-04 DIAGNOSIS — G894 Chronic pain syndrome: Secondary | ICD-10-CM

## 2017-05-04 DIAGNOSIS — M5417 Radiculopathy, lumbosacral region: Secondary | ICD-10-CM | POA: Insufficient documentation

## 2017-05-04 DIAGNOSIS — Z6841 Body Mass Index (BMI) 40.0 and over, adult: Secondary | ICD-10-CM | POA: Insufficient documentation

## 2017-05-04 DIAGNOSIS — M5416 Radiculopathy, lumbar region: Secondary | ICD-10-CM

## 2017-05-04 MED ORDER — SODIUM CHLORIDE 0.9% FLUSH
1.0000 mL | Freq: Once | INTRAVENOUS | Status: AC
  Administered 2017-05-04: 10 mL

## 2017-05-04 MED ORDER — ROPIVACAINE HCL 2 MG/ML IJ SOLN
INTRAMUSCULAR | Status: AC
  Filled 2017-05-04: qty 10

## 2017-05-04 MED ORDER — ROPIVACAINE HCL 2 MG/ML IJ SOLN
10.0000 mL | Freq: Once | INTRAMUSCULAR | Status: AC
  Administered 2017-05-04: 10 mL
  Filled 2017-05-04: qty 10

## 2017-05-04 MED ORDER — IOPAMIDOL (ISOVUE-M 200) INJECTION 41%
10.0000 mL | Freq: Once | INTRAMUSCULAR | Status: AC
  Administered 2017-05-04: 10 mL via EPIDURAL
  Filled 2017-05-04: qty 10

## 2017-05-04 MED ORDER — LIDOCAINE HCL (PF) 1 % IJ SOLN
10.0000 mL | Freq: Once | INTRAMUSCULAR | Status: AC
  Administered 2017-05-04: 5 mL
  Filled 2017-05-04: qty 10

## 2017-05-04 MED ORDER — DEXAMETHASONE SODIUM PHOSPHATE 10 MG/ML IJ SOLN
10.0000 mg | Freq: Once | INTRAMUSCULAR | Status: AC
  Administered 2017-05-04: 10 mg
  Filled 2017-05-04: qty 1

## 2017-05-04 NOTE — Progress Notes (Signed)
Patient's Name: Colleen Cook  MRN: [id number]  Referring Provider: Edward Jolly, MD  DOB: 1991/05/16  PCP: Trey Sailors, PA-C  DOS: 05/04/2017  Note by: Edward Jolly, MD  Service setting: Ambulatory outpatient  Specialty: Interventional Pain Management  Patient type: Established  Location: [facility] (AMB) Pain Management Facility  Visit type: Interventional Procedure   Primary Reason for Visit: Interventional Pain Management Treatment. CC: Back Pain (low left)  Procedure:  Anesthesia, Analgesia, Anxiolysis:  Type: Therapeutic Inter-Laminar Epidural Steroid Injection Region: Lumbar Level: L5-S1 Level. Laterality: Left-Sided         Type: Local Anesthesia Local Anesthetic: Lidocaine 1% Route: Infiltration ([place]/IM) IV Access: Secured Sedation: Meaningful verbal contact was maintained at all times during the procedure  Indication(s): Analgesia and Anxiety  Indications: 1. Lumbar radiculopathy   2. Morbid obesity (HCC)   3. Chronic pain syndrome    Pain Score: Pre-procedure: 7 /10 Post-procedure: 0-No pain/10  Pre-op Assessment:  Colleen Cook is a 26 y.o. (year old), female patient, seen today for interventional treatment. She  has a past surgical history that includes Tonsillectomy and Wisdom tooth extraction. Colleen Cook has a current medication list which includes the following prescription(s): clonazepam, ibuprofen, oxycodone, sertraline, tizanidine, topiramate, and ibuprofen. Her primarily concern today is the Back Pain (low left)  26 year old female with history of morbid obesity, PTSD with lumbosacral radiculopathy left greater than right, here for lumbar epidural steroid injection.  Initial Vital Signs: Last menstrual period 04/07/2017. BMI: Estimated body mass index is 40.29 kg/m as calculated from the following:   Height as of this encounter:  (1.727 m).   Weight as of this encounter: 265 lb (120.2 kg).  Risk Assessment: Allergies: Reviewed. She has  No Known Allergies.  Allergy Precautions: None required Coagulopathies: Reviewed. None identified.  Blood-thinner therapy: None at this time Active Infection(s): Reviewed. None identified. Colleen Cook is afebrile  Site Confirmation: Colleen Cook was asked to confirm the procedure and laterality before marking the site Procedure checklist: Completed Consent: Before the procedure and under the influence of no sedative(s), amnesic(s), or anxiolytics, the patient was informed of the treatment options, risks and possible complications. To fulfill our ethical and legal obligations, as recommended by the American Medical Association's Code of Ethics, I have informed the patient of my clinical impression; the nature and purpose of the treatment or procedure; the risks, benefits, and possible complications of the intervention; the alternatives, including doing nothing; the risk(s) and benefit(s) of the alternative treatment(s) or procedure(s); and the risk(s) and benefit(s) of doing nothing. The patient was provided information about the general risks and possible complications associated with the procedure. These may include, but are not limited to: failure to achieve desired goals, infection, bleeding, organ or nerve damage, allergic reactions, paralysis, and death. In addition, the patient was informed of those risks and complications associated to Spine-related procedures, such as failure to decrease pain; infection (i.e.: Meningitis, epidural or intraspinal abscess); bleeding (i.e.: epidural hematoma, subarachnoid hemorrhage, or any other type of intraspinal or peri-dural bleeding); organ or nerve damage (i.e.: Any type of peripheral nerve, nerve root, or spinal cord injury) with subsequent damage to sensory, motor, and/or autonomic systems, resulting in permanent pain, numbness, and/or weakness of one or several areas of the body; allergic reactions; (i.e.: anaphylactic reaction); and/or  death. Furthermore, the patient was informed of those risks and complications associated with the medications. These include, but are not limited to: allergic reactions (i.e.: anaphylactic or anaphylactoid reaction(s)); adrenal axis suppression; blood sugar  elevation that in diabetics may result in ketoacidosis or comma; water retention that in patients with history of congestive heart failure may result in shortness of breath, pulmonary edema, and decompensation with resultant heart failure; weight gain; swelling or edema; medication-induced neural toxicity; particulate matter embolism and blood vessel occlusion with resultant organ, and/or nervous system infarction; and/or aseptic necrosis of one or more joints. Finally, the patient was informed that Medicine is not an exact science; therefore, there is also the possibility of unforeseen or unpredictable risks and/or possible complications that may result in a catastrophic outcome. The patient indicated having understood very clearly. We have given the patient no guarantees and we have made no promises. Enough time was given to the patient to ask questions, all of which were answered to the patient's satisfaction. Colleen Cook has indicated that she wanted to continue with the procedure. Attestation: I, the ordering provider, attest that I have discussed with the patient the benefits, risks, side-effects, alternatives, likelihood of achieving goals, and potential problems during recovery for the procedure that I have provided informed consent. Date: 05/04/2017; Time: 8:50 AM  Pre-Procedure Preparation:  Monitoring: As per clinic protocol. Respiration, ETCO2, SpO2, BP, heart rate and rhythm monitor placed and checked for adequate function Safety Precautions: Patient was assessed for positional comfort and pressure points before starting the procedure. Time-out: I initiated and conducted the "Time-out" before starting the procedure, as per protocol. The  patient was asked to participate by confirming the accuracy of the "Time Out" information. Verification of the correct person, site, and procedure were performed and confirmed by me, the nursing staff, and the patient. "Time-out" conducted as per Joint Commission's Universal Protocol (UP.01.01.01). "Time-out" Date & Time: 05/04/2017; 0935 hrs.  Description of Procedure Process:   Position: Prone with head of the table was raised to facilitate breathing. Target Area: The interlaminar space, initially targeting the lower laminar border of the superior vertebral body. Approach: Paramedial approach. Area Prepped: Entire Posterior Lumbar Region Prepping solution: ChloraPrep (2% chlorhexidine gluconate and 70% isopropyl alcohol) Safety Precautions: Aspiration looking for blood return was conducted prior to all injections. At no point did we inject any substances, as a needle was being advanced. No attempts were made at seeking any paresthesias. Safe injection practices and needle disposal techniques used. Medications properly checked for expiration dates. SDV (single dose vial) medications used. Description of the Procedure: Protocol guidelines were followed. The procedure needle was introduced through the skin, ipsilateral to the reported pain, and advanced to the target area. Bone was contacted and the needle walked caudad, until the lamina was cleared. The epidural space was identified using "loss-of-resistance technique" with 2-3 ml of PF-NaCl (0.9% NSS), in a 5cc LOR glass syringe. Vitals:   05/04/17 0942 05/04/17 0947 05/04/17 0952 05/04/17 0958  BP: 116/69 (!) 141/84 113/88 140/88  Pulse:      Resp: 20 (!) Temp:      SpO2: 94% 97% 94% 97%  Weight:      Height:        Start Time: 0935 hrs. End Time: 0958 hrs. Materials:  Needle(s) Type: Epidural needle Gauge: 17G Length: 5-in Medication(s): We administered lidocaine (PF), ropivacaine (PF) 2 mg/mL (0.2%), dexamethasone, iopamidol,  and sodium chloride flush. Please see chart orders for dosing details. 6 cc of normal saline, 1 cc of Decadron, 1.5 cc of 0.2% ropivacaine equals 8.5 Imaging Guidance (Spinal):  Type of Imaging Technique: Fluoroscopy Guidance (Spinal) Indication(s): Assistance in needle guidance and placement for  procedures requiring needle placement in or near specific anatomical locations not easily accessible without such assistance. Exposure Time: Please see nurses notes. Contrast: Before injecting any contrast, we confirmed that the patient did not have an allergy to iodine, shellfish, or radiological contrast. Once satisfactory needle placement was completed at the desired level, radiological contrast was injected. Contrast injected under live fluoroscopy. No contrast complications. See chart for type and volume of contrast used. Fluoroscopic Guidance: I was personally present during the use of fluoroscopy. "Tunnel Vision Technique" used to obtain the best possible view of the target area. Parallax error corrected before commencing the procedure. "Direction-depth-direction" technique used to introduce the needle under continuous pulsed fluoroscopy. Once target was reached, antero-posterior, oblique, and lateral fluoroscopic projection used confirm needle placement in all planes. Images permanently stored in EMR. Interpretation: I personally interpreted the imaging intraoperatively. Adequate needle placement confirmed in multiple planes. Appropriate spread of contrast into desired area was observed. No evidence of afferent or efferent intravascular uptake. No intrathecal or subarachnoid spread observed. Permanent images saved into the patient's record.  Antibiotic Prophylaxis:  Indication(s): None identified Antibiotic given: None  Post-operative Assessment:  EBL: None Complications: No immediate post-treatment complications observed by team, or reported by patient. Note: The patient tolerated the entire  procedure well. A repeat set of vitals were taken after the procedure and the patient was kept under observation following institutional policy, for this type of procedure. Post-procedural neurological assessment was performed, showing return to baseline, prior to discharge. The patient was provided with post-procedure discharge instructions, including a section on how to identify potential problems. Should any problems arise concerning this procedure, the patient was given instructions to immediately contact us, at any time, without hesitation. In any case, we plan to contact the patient by telephone for a follow-up status report regarding this interventional procedure. Comments:  No additional relevant information.  Plan of Care  Disposition: Discharge home  Discharge Date & Time: 05/04/2017; 1006 hrs.  5 out of 5 strength bilateral lower extremity upon discharge: Plantar flexion, dorsiflexion, knee flexion, knee extension.  Patient instructed to increase Topamax to 50 mg twice daily. She already has prescription.  Physician-requested Follow-up:  Return in about 2 weeks (around 05/18/2017) for Post Procedure Evaluation.  Future Appointments Date Time Provider Department Center  05/19/2017 8:45 AM Edward Jolly, MD [facility] None    Imaging Orders     DG C-Arm 1-60 Min-No Report Procedure Orders    No procedure(s) ordered today    Medications ordered for procedure: Meds ordered this encounter  Medications  . lidocaine (PF) (XYLOCAINE) 1 % injection 10 mL  . ropivacaine (PF) 2 mg/mL (0.2%) (NAROPIN) injection 10 mL  . dexamethasone (DECADRON) injection 10 mg  . iopamidol (ISOVUE-M) 41 % intrathecal injection 10 mL  . sodium chloride flush (NS) 0.9 % injection 1 mL   Medications administered: We administered lidocaine (PF), ropivacaine (PF) 2 mg/mL (0.2%), dexamethasone, iopamidol, and sodium chloride flush.  See the medical record for exact dosing, route, and time of  administration.  New Prescriptions   No medications on file   Primary Care Physician: Maryella Shivers Location: [facility] Note by: Edward Jolly, MD Date: 05/04/2017; Time: 10:25 AM  Disclaimer:  Medicine is not an exact science. The only guarantee in medicine is that nothing is guaranteed. It is important to note that the decision to proceed with this intervention was based on the information collected from the patient. The Data and conclusions were  drawn from the patient's questionnaire, the interview, and the physical examination. Because the information was provided in large part by the patient, it cannot be guaranteed that it has not been purposely or unconsciously manipulated. Every effort has been made to obtain as much relevant data as possible for this evaluation. It is important to note that the conclusions that lead to this procedure are derived in large part from the available data. Always take into account that the treatment will also be dependent on availability of resources and existing treatment guidelines, considered by other Pain Management Practitioners as being common knowledge and practice, at the time of the intervention. For Medico-Legal purposes, it is also important to point out that variation in procedural techniques and pharmacological choices are the acceptable norm. The indications, contraindications, technique, and results of the above procedure should only be interpreted and judged by a Board-Certified Interventional Pain Specialist with extensive familiarity and expertise in the same exact procedure and technique.

## 2017-05-04 NOTE — Patient Instructions (Signed)

## 2017-05-05 ENCOUNTER — Ambulatory Visit: Payer: 59 | Admitting: Student in an Organized Health Care Education/Training Program

## 2017-05-05 ENCOUNTER — Telehealth: Payer: Self-pay | Admitting: *Deleted

## 2017-05-05 NOTE — Telephone Encounter (Signed)
Attempted to call patient for post procedure follow-up. Message left. 

## 2017-05-19 ENCOUNTER — Encounter: Payer: Self-pay | Admitting: Student in an Organized Health Care Education/Training Program

## 2017-05-19 ENCOUNTER — Ambulatory Visit
Payer: 59 | Attending: Student in an Organized Health Care Education/Training Program | Admitting: Student in an Organized Health Care Education/Training Program

## 2017-05-19 VITALS — BP 154/81 | HR 86 | Temp 98.2°F | Resp 16 | Ht 68.0 in | Wt 265.0 lb

## 2017-05-19 DIAGNOSIS — M47816 Spondylosis without myelopathy or radiculopathy, lumbar region: Secondary | ICD-10-CM

## 2017-05-19 DIAGNOSIS — Z8249 Family history of ischemic heart disease and other diseases of the circulatory system: Secondary | ICD-10-CM | POA: Diagnosis not present

## 2017-05-19 DIAGNOSIS — M5416 Radiculopathy, lumbar region: Secondary | ICD-10-CM

## 2017-05-19 DIAGNOSIS — M545 Low back pain: Secondary | ICD-10-CM | POA: Diagnosis present

## 2017-05-19 DIAGNOSIS — M4726 Other spondylosis with radiculopathy, lumbar region: Secondary | ICD-10-CM | POA: Insufficient documentation

## 2017-05-19 DIAGNOSIS — F331 Major depressive disorder, recurrent, moderate: Secondary | ICD-10-CM | POA: Diagnosis not present

## 2017-05-19 DIAGNOSIS — Z6841 Body Mass Index (BMI) 40.0 and over, adult: Secondary | ICD-10-CM | POA: Insufficient documentation

## 2017-05-19 DIAGNOSIS — F909 Attention-deficit hyperactivity disorder, unspecified type: Secondary | ICD-10-CM | POA: Insufficient documentation

## 2017-05-19 DIAGNOSIS — Z79899 Other long term (current) drug therapy: Secondary | ICD-10-CM | POA: Insufficient documentation

## 2017-05-19 DIAGNOSIS — F411 Generalized anxiety disorder: Secondary | ICD-10-CM | POA: Diagnosis not present

## 2017-05-19 DIAGNOSIS — G894 Chronic pain syndrome: Secondary | ICD-10-CM

## 2017-05-19 DIAGNOSIS — Z803 Family history of malignant neoplasm of breast: Secondary | ICD-10-CM | POA: Diagnosis not present

## 2017-05-19 DIAGNOSIS — F4310 Post-traumatic stress disorder, unspecified: Secondary | ICD-10-CM | POA: Diagnosis not present

## 2017-05-19 MED ORDER — TIZANIDINE HCL 4 MG PO TABS: 4.0000 mg | ORAL_TABLET | Freq: Four times a day (QID) | ORAL | 0 refills | Status: DC | PRN

## 2017-05-19 MED ORDER — TOPIRAMATE 50 MG PO TABS: 50.0000 mg | ORAL_TABLET | Freq: Two times a day (BID) | ORAL | 2 refills | Status: DC

## 2017-05-19 NOTE — Patient Instructions (Signed)
1. Increase Topamax to 50 mg twice daily, Rx sent 2. Refill Tizanidine 3. Follow up in 8 weeks
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
## 2017-05-19 NOTE — Progress Notes (Signed)
Safety precautions to be maintained throughout the outpatient stay will include: orient to surroundings, keep bed in low position, maintain call bell within reach at all times, provide assistance with transfer out of bed and ambulation.  

## 2017-05-27 ENCOUNTER — Telehealth: Payer: Self-pay | Admitting: Physician Assistant

## 2017-05-27 NOTE — Telephone Encounter (Signed)
[facility]  ( Dr. Hollace Kinnierarol Klein).

## 2017-07-14 ENCOUNTER — Ambulatory Visit: Payer: 59 | Admitting: Student in an Organized Health Care Education/Training Program

## 2017-07-28 ENCOUNTER — Encounter: Payer: 59 | Admitting: Student in an Organized Health Care Education/Training Program

## 2017-08-01 ENCOUNTER — Other Ambulatory Visit
Admission: AD | Admit: 2017-08-01 | Discharge: 2017-08-01 | Disposition: A | Discharge status: Home / Self Care | Attending: Family Medicine | Admitting: Family Medicine

## 2017-08-14 ENCOUNTER — Encounter: Payer: 59 | Admitting: Physician Assistant

## 2017-10-29 ENCOUNTER — Emergency Department: Payer: 59

## 2017-10-29 ENCOUNTER — Emergency Department
Admission: EM | Admit: 2017-10-29 | Discharge: 2017-10-30 | Disposition: A | Discharge status: Home / Self Care | Payer: 59 | Attending: Emergency Medicine | Admitting: Emergency Medicine

## 2017-10-29 ENCOUNTER — Encounter: Payer: Self-pay | Admitting: Emergency Medicine

## 2017-10-29 ENCOUNTER — Other Ambulatory Visit: Payer: Self-pay

## 2017-10-29 DIAGNOSIS — Z79899 Other long term (current) drug therapy: Secondary | ICD-10-CM | POA: Diagnosis not present

## 2017-10-29 DIAGNOSIS — R109 Unspecified abdominal pain: Secondary | ICD-10-CM | POA: Diagnosis present

## 2017-10-29 DIAGNOSIS — K529 Noninfective gastroenteritis and colitis, unspecified: Secondary | ICD-10-CM | POA: Insufficient documentation

## 2017-10-29 DIAGNOSIS — K625 Hemorrhage of anus and rectum: Secondary | ICD-10-CM | POA: Insufficient documentation

## 2017-10-29 LAB — COMPREHENSIVE METABOLIC PANEL
ALBUMIN: 3.8 g/dL (ref 3.5–5.0)
ALT: 18 U/L (ref 14–54)
AST: 16 U/L (ref 15–41)
Alkaline Phosphatase: 59 U/L (ref 38–126)
Anion gap: 8 (ref 5–15)
BILIRUBIN TOTAL: 0.6 mg/dL (ref 0.3–1.2)
BUN: 13 mg/dL (ref 6–20)
CALCIUM: 9.4 mg/dL (ref 8.9–10.3)
CO2: 25 mmol/L (ref 22–32)
Chloride: 104 mmol/L (ref 101–111)
Creatinine, Ser: 0.89 mg/dL (ref 0.44–1.00)
GFR calc Af Amer: >60 mL/min (ref >60)
GLUCOSE: 104 mg/dL — AB (ref 65–99)
Potassium: 3.7 mmol/L (ref 3.5–5.1)
Sodium: 137 mmol/L (ref 135–145)
TOTAL PROTEIN: 7.3 g/dL (ref 6.5–8.1)

## 2017-10-29 LAB — CBC
HCT: 39.9 % (ref 35.0–47.0)
Hemoglobin: 13.3 g/dL (ref 12.0–16.0)
MCH: 29.8 pg (ref 26.0–34.0)
MCHC: 33.2 g/dL (ref 32.0–36.0)
MCV: 89.9 fL (ref 80.0–100.0)
PLATELETS: 351 10*3/uL (ref 150–440)
RBC: 4.44 MIL/uL (ref 3.80–5.20)
RDW: 13.1 % (ref 11.5–14.5)
WBC: 14.2 10*3/uL — AB (ref 3.6–11.0)

## 2017-10-29 LAB — TYPE AND SCREEN
ABO/RH(D): A NEG
ANTIBODY SCREEN: NEGATIVE

## 2017-10-29 LAB — POCT PREGNANCY, URINE: Preg Test, Ur: NEGATIVE

## 2017-10-29 MED ORDER — AMOXICILLIN-POT CLAVULANATE 875-125 MG PO TABS: 1.0000 | ORAL_TABLET | Freq: Two times a day (BID) | ORAL | 0 refills | Status: AC

## 2017-10-29 MED ORDER — ONDANSETRON HCL 4 MG/2ML IJ SOLN: 4.0000 mg | Freq: Once | INTRAMUSCULAR | Status: DC

## 2017-10-29 MED ORDER — ACETAMINOPHEN 500 MG PO TABS
1000.0000 mg | ORAL_TABLET | Freq: Once | ORAL | Status: AC
  Administered 2017-10-29: 1000 mg via ORAL
  Filled 2017-10-29: qty 2

## 2017-10-29 MED ORDER — SODIUM CHLORIDE 0.9 % IV BOLUS (SEPSIS): 1000.0000 mL | Freq: Once | INTRAVENOUS | Status: DC

## 2017-10-29 MED ORDER — AMOXICILLIN-POT CLAVULANATE 875-125 MG PO TABS
1.0000 | ORAL_TABLET | Freq: Once | ORAL | Status: AC
  Administered 2017-10-29: 1 via ORAL
  Filled 2017-10-29: qty 1

## 2017-10-29 MED ORDER — ONDANSETRON 4 MG PO TBDP: 4.0000 mg | ORAL_TABLET | Freq: Three times a day (TID) | ORAL | 0 refills | Status: DC | PRN

## 2017-10-29 MED ORDER — FENTANYL CITRATE (PF) 100 MCG/2ML IJ SOLN: 50.0000 ug | Freq: Once | INTRAMUSCULAR | Status: DC

## 2017-10-29 MED ORDER — METRONIDAZOLE 500 MG PO TABS
500.0000 mg | ORAL_TABLET | Freq: Once | ORAL | Status: AC
  Administered 2017-10-29: 500 mg via ORAL
  Filled 2017-10-29: qty 1

## 2017-10-29 MED ORDER — IOPAMIDOL (ISOVUE-300) INJECTION 61%
100.0000 mL | Freq: Once | INTRAVENOUS | Status: AC | PRN
  Administered 2017-10-29: 100 mL via INTRAVENOUS

## 2017-10-29 MED ORDER — OXYCODONE HCL 5 MG PO TABS
5.0000 mg | ORAL_TABLET | Freq: Once | ORAL | Status: AC
  Administered 2017-10-29: 5 mg via ORAL
  Filled 2017-10-29: qty 1

## 2017-10-29 MED ORDER — OXYCODONE-ACETAMINOPHEN 5-325 MG PO TABS: 1.0000 | ORAL_TABLET | ORAL | 0 refills | Status: DC | PRN

## 2017-10-29 MED ORDER — METRONIDAZOLE 500 MG PO TABS: 500.0000 mg | ORAL_TABLET | Freq: Three times a day (TID) | ORAL | 0 refills | Status: AC

## 2017-10-29 NOTE — ED Notes (Signed)
Pt returned from CT °

## 2017-10-29 NOTE — ED Triage Notes (Signed)
Pt comes into the ED via POV c/o sudden abdominal cramps that started yesterday.  Patient was having watery diarrhea with bright red blood present in every stool.  Patient had had the symptoms continue today and she is still bleeding.  Patient states that every time she eats or drinks her stomach starts "violently cramping".  Patient in NAD at this time with even and unlabored respirations.

## 2017-10-29 NOTE — ED Notes (Signed)
MD at bedside. 

## 2017-10-29 NOTE — ED Notes (Signed)
Informed RN that patient has been roomed and is ready for evaluation.  Patient in NAD at this time and call bell placed within reach.   

## 2017-10-29 NOTE — ED Provider Notes (Signed)
[facility] Emergency Department Provider Note  ____________________________________________  Time seen: Approximately 9:47 PM  I have reviewed the triage vital signs and the nursing notes.   HISTORY  Chief Complaint Abdominal Pain and Rectal Bleeding   HPI Colleen Cook is a 27 y.o. female with a history of chronic back pain on daily NSAIDs who presents for evaluation of abdominal cramping and rectal bleeding.  Patient reports that her symptoms started yesterday at 4 AM and woke her up from her sleep.  She is complaining of constant severe cramping abdominal pain located in her lower abdominal area.  She reports her first bowel movement was watery with no blood.  Since then she has had 6 or 7 episodes of bloody stool, no melena.  She has had nausea but no vomiting.  She reports that her last bowel movement was this morning and had blood in it.  She has not had a bowel movement in almost 12 hours.  She denies dizziness, chest pain or shortness of breath.  Patient reports that she takes an average of 1600 mg of ibuprofen a day every day for back pain and a bulging disc.  She denies any prior history of GI bleed.  She denies any recent antibiotic use or history of C. difficile.  At this time she is complaining of 6 out of 10 crampy abdominal pain.  Past Medical History:  Diagnosis Date  . Lumbar radiculopathy     Patient Active Problem List   Diagnosis Date Noted  . Lumbar radiculopathy 04/14/2017  . Spondylosis without myelopathy or radiculopathy, lumbar region 04/14/2017  . Morbid obesity (HCC) 04/14/2017  . Attention deficit hyperactivity disorder (ADHD) 04/14/2017  . Moderate episode of recurrent major depressive disorder (HCC) 04/14/2017  . Generalized anxiety disorder 04/14/2017  . Chronic pain syndrome 04/14/2017  . PTSD (post-traumatic stress disorder) 04/14/2017    Past Surgical History:  Procedure Laterality Date  . TONSILLECTOMY      . WISDOM TOOTH EXTRACTION      Prior to Admission medications   Medication Sig Start Date End Date Taking? Authorizing Provider  amoxicillin-clavulanate (AUGMENTIN) 875-125 MG tablet Take 1 tablet by mouth 2 (two) times daily for 10 days. 10/29/17 11/08/17  Nita SickleVeronese, [place], MD  clonazePAM (KLONOPIN) 0.5 MG tablet Take 0.5 mg by mouth 2 (two) times daily as needed. 03/19/17   [provider]  ibuprofen (ADVIL,MOTRIN) 200 MG tablet Take 800 mg by mouth every 8 (eight) hours as needed.    [provider]  ibuprofen (ADVIL,MOTRIN) 800 MG tablet  03/21/15   [provider]  metroNIDAZOLE (FLAGYL) 500 MG tablet Take 1 tablet (500 mg total) by mouth 3 (three) times daily for 10 days. 10/29/17 11/08/17  Nita SickleVeronese, [place], MD  ondansetron (ZOFRAN ODT) 4 MG disintegrating tablet Take 1 tablet (4 mg total) by mouth every 8 (eight) hours as needed for nausea or vomiting. 10/29/17   Don PerkingVeronese, [place], MD  oxyCODONE (OXY IR/ROXICODONE) 5 MG immediate release tablet Take 5 mg by mouth every 4 (four) hours as needed for severe pain.    [provider]  oxyCODONE-acetaminophen (PERCOCET) 5-325 MG tablet Take 1 tablet by mouth every 4 (four) hours as needed for severe pain. 10/29/17   Nita SickleVeronese, [place], MD  sertraline (ZOLOFT) 50 MG tablet Take 50 mg by mouth daily.    [provider]  tiZANidine (ZANAFLEX) 4 MG tablet Take 1 tablet (4 mg total) by mouth every 6 (six) hours as needed for muscle  spasms. 05/19/17   Edward Jolly, MD  topiramate (TOPAMAX) 50 MG tablet Take 1 tablet (50 mg total) by mouth 2 (two) times daily. 05/19/17 05/19/18  Edward Jolly, MD    Allergies Patient has no known allergies.  Family History  Problem Relation Age of Onset  . Breast cancer Mother 58  . Hypertension Mother        Resolved  . Healthy Brother   . Stroke Maternal Grandmother   . Skin cancer Maternal Grandmother   . Healthy Brother     Social History Social History    Tobacco Use  . Smoking status: Never Smoker  . Smokeless tobacco: Never Used  Substance Use Topics  . Alcohol use: No  . Drug use: No    Review of Systems  Constitutional: Negative for fever. Eyes: Negative for visual changes. ENT: Negative for sore throat. Neck: No neck pain  Cardiovascular: Negative for chest pain. Respiratory: Negative for shortness of breath. Gastrointestinal: + lower abdominal pain, nausea, and bloody diarrhea. No vomiting  Genitourinary: Negative for dysuria. Musculoskeletal: Negative for back pain. Skin: Negative for rash. Neurological: Negative for headaches, weakness or numbness. Psych: No SI or HI  ____________________________________________   PHYSICAL EXAM:  VITAL SIGNS: ED Triage Vitals [10/29/17 1719]  Enc Vitals Group     BP 138/80     Pulse Rate 88     Resp 17     Temp 98.2 F (36.8 C)     Temp Source Oral     SpO2 96 %     Weight 240 lb (108.9 kg)     Height 5\' 8"  (1.727 m)     Head Circumference      Peak Flow      Pain Score 8     Pain Loc      Pain Edu?      Excl. in GC?     Constitutional: Alert and oriented. Well appearing and in no apparent distress. HEENT:      Head: Normocephalic and atraumatic.         Eyes: Conjunctivae are normal. Sclera is non-icteric.       Mouth/Throat: Mucous membranes are moist.       Neck: Supple with no signs of meningismus. Cardiovascular: Regular rate and rhythm. No murmurs, gallops, or rubs. 2+ symmetrical distal pulses are present in all extremities. No JVD. Respiratory: Normal respiratory effort. Lungs are clear to auscultation bilaterally. No wheezes, crackles, or rhonchi.  Gastrointestinal: Soft, mild diffuse lower abdominal tenderness, and non distended with positive bowel sounds. No rebound or guarding. Genitourinary: No CVA tenderness.  Rectal exam showed no hemorrhoids, no anal fissure, light brown stool faintly guaiac positive Musculoskeletal: Nontender with normal range of  motion in all extremities. No edema, cyanosis, or erythema of extremities. Neurologic: Normal speech and language. Face is symmetric. Moving all extremities. No gross focal neurologic deficits are appreciated. Skin: Skin is warm, dry and intact. No rash noted. Psychiatric: Mood and affect are normal. Speech and behavior are normal.  ____________________________________________   LABS (all labs ordered are listed, but only abnormal results are displayed)  Labs Reviewed  COMPREHENSIVE METABOLIC PANEL - Abnormal; Notable for the following components:      Result Value   Glucose, Bld 104 (*)    All other components within normal limits  CBC - Abnormal; Notable for the following components:   WBC 14.2 (*)    All other components within normal limits  C DIFFICILE QUICK SCREEN W PCR REFLEX  POCT PREGNANCY, URINE  POC OCCULT BLOOD, ED  POC URINE PREG, ED  TYPE AND SCREEN   ____________________________________________  EKG  none  ____________________________________________  RADIOLOGY  I have personally reviewed the images performed during this visit and I agree with the Radiologist's read.   Interpretation by Radiologist:  Ct Abdomen Pelvis W Contrast  Result Date: 10/29/2017 CLINICAL DATA:  Abdominal cramping and GI bleed. EXAM: CT ABDOMEN AND PELVIS WITH CONTRAST TECHNIQUE: Multidetector CT imaging of the abdomen and pelvis was performed using the standard protocol following bolus administration of intravenous contrast. CONTRAST:  ISOVUE-300 IOPAMIDOL (ISOVUE-300) INJECTION 61% COMPARISON:  None. FINDINGS: Lower chest: Lung bases are clear. Hepatobiliary: Focal hepatic lesion. Gallbladder contracted without gallstone or pericholecystic inflammation. No biliary dilatation. Pancreas: No ductal dilatation or inflammation. Spleen: Normal in size without focal abnormality. Adrenals/Urinary Tract: Adrenal glands are unremarkable. Kidneys are normal, without renal calculi, focal  lesion, or hydronephrosis. Bladder is unremarkable. Stomach/Bowel: Lock segment colonic wall thickening with mild pericolonic edema of descending and sigmoid colon. Possible involvement of the distal transverse colon as well. Formed stool in the proximal colon without additional colonic wall thickening. Appendix tentatively identified and normal. The terminal ileum is normal. No small bowel inflammatory change, wall thickening or obstruction. Stomach is nondistended. Vascular/Lymphatic: No significant vascular findings are present. No enlarged abdominal or pelvic lymph nodes. Reproductive: Uterus and bilateral adnexa are unremarkable. Other: No free air free fluid.  No intra-abdominal abscess. Musculoskeletal: There are no acute or suspicious osseous abnormalities. IMPRESSION: Long segment colonic wall thickening from the splenic flexure distally consistent with colitis, likely infectious or inflammatory. No small bowel inflammatory change. Electronically Signed   By: Rubye Oaks M.D.   On: 10/29/2017 22:31      ____________________________________________   PROCEDURES  Procedure(s) performed: None Procedures Critical Care performed:  None ____________________________________________   INITIAL IMPRESSION / ASSESSMENT AND PLAN / ED COURSE   27 y.o. female with a history of chronic back pain on daily NSAIDs who presents for evaluation of abdominal cramping and rectal bleeding.  Patient is hemodynamically stable, last bloody bowel movement was 12 hours ago, rectal exam showing light brown stool faintly guaiac positive, no fissures or hemorrhoids, abdomen is soft with diffuse mild tenderness in the lower quadrants with no rebound or guarding.  Labs showing leukocytosis with white count of 14, stable hemoglobin at 13.3.  Differential diagnosis including colitis versus diverticular bleed versus enteritis versus C. difficile.  Will do a CT abdomen pelvis to evaluate for evidence of colitis of or  diverticulitis requiring antibiotic use.  Will treat with fentanyl, IV fluids, and Zofran.  No evidence of brisk upper GI bleed at this time.    _________________________ 10:52 PM on 10/29/2017 -----------------------------------------  CT concerning for colitis.  Patient remains with no further episodes of bloody stool in the emergency room, hemodynamically stable.  She was started on Flagyl and Augmentin.  Skin to be discharged home on a 10-day course.  Patient was given Percocet and Zofran for symptom relief.  Discussed return precautions.   As part of my medical decision making, I reviewed the following data within the electronic MEDICAL RECORD NUMBER Nursing notes reviewed and incorporated, Labs reviewed , Radiograph reviewed , Notes from prior ED visits and [place] Controlled Substance Database    Pertinent labs & imaging results that were available during my care of the patient were reviewed by me and considered in my medical decision making (see chart for details).    ____________________________________________  FINAL CLINICAL IMPRESSION(S) / ED DIAGNOSES  Final diagnoses:  Colitis      NEW MEDICATIONS STARTED DURING THIS VISIT:  ED Discharge Orders        Ordered    metroNIDAZOLE (FLAGYL) 500 MG tablet  3 times daily     10/29/17 2250    amoxicillin-clavulanate (AUGMENTIN) 875-125 MG tablet  2 times daily     10/29/17 2250    oxyCODONE-acetaminophen (PERCOCET) 5-325 MG tablet  Every 4 hours PRN     10/29/17 2250    ondansetron (ZOFRAN ODT) 4 MG disintegrating tablet  Every 8 hours PRN     10/29/17 2250       Note:  This document was prepared using Dragon voice recognition software and may include unintentional dictation errors.    Nita Sickle, MD 10/29/17 2253

## 2017-11-04 ENCOUNTER — Encounter: Payer: Self-pay | Admitting: Physician Assistant

## 2017-11-04 ENCOUNTER — Ambulatory Visit (INDEPENDENT_AMBULATORY_CARE_PROVIDER_SITE_OTHER): Payer: 59 | Admitting: Physician Assistant

## 2017-11-04 VITALS — BP 120/70 | HR 108 | Temp 97.7°F | Resp 16 | Ht 68.0 in | Wt 257.0 lb

## 2017-11-04 DIAGNOSIS — M5416 Radiculopathy, lumbar region: Secondary | ICD-10-CM | POA: Diagnosis not present

## 2017-11-04 DIAGNOSIS — L680 Hirsutism: Secondary | ICD-10-CM | POA: Diagnosis not present

## 2017-11-04 DIAGNOSIS — Z0000 Encounter for general adult medical examination without abnormal findings: Secondary | ICD-10-CM

## 2017-11-04 DIAGNOSIS — Z124 Encounter for screening for malignant neoplasm of cervix: Secondary | ICD-10-CM | POA: Diagnosis not present

## 2017-11-04 DIAGNOSIS — G894 Chronic pain syndrome: Secondary | ICD-10-CM

## 2017-11-04 DIAGNOSIS — Z1239 Encounter for other screening for malignant neoplasm of breast: Secondary | ICD-10-CM

## 2017-11-04 DIAGNOSIS — F331 Major depressive disorder, recurrent, moderate: Secondary | ICD-10-CM | POA: Diagnosis not present

## 2017-11-04 DIAGNOSIS — G479 Sleep disorder, unspecified: Secondary | ICD-10-CM | POA: Diagnosis not present

## 2017-11-04 DIAGNOSIS — Z1231 Encounter for screening mammogram for malignant neoplasm of breast: Secondary | ICD-10-CM

## 2017-11-04 DIAGNOSIS — F609 Personality disorder, unspecified: Secondary | ICD-10-CM | POA: Insufficient documentation

## 2017-11-04 DIAGNOSIS — F4310 Post-traumatic stress disorder, unspecified: Secondary | ICD-10-CM

## 2017-11-04 DIAGNOSIS — Z6839 Body mass index (BMI) 39.0-39.9, adult: Secondary | ICD-10-CM | POA: Diagnosis not present

## 2017-11-04 NOTE — Patient Instructions (Signed)
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
## 2017-11-05 ENCOUNTER — Encounter: Payer: Self-pay | Admitting: Physician Assistant

## 2017-11-06 ENCOUNTER — Telehealth: Payer: Self-pay

## 2017-11-06 LAB — CBC WITH DIFFERENTIAL/PLATELET
Basophils Absolute: 0 10*3/uL (ref 0.0–0.2)
Basos: 1 %
EOS (ABSOLUTE): 0.2 10*3/uL (ref 0.0–0.4)
EOS: 2 %
HEMATOCRIT: 40.1 % (ref 34.0–46.6)
HEMOGLOBIN: 13.8 g/dL (ref 11.1–15.9)
IMMATURE GRANULOCYTES: 1 %
Immature Grans (Abs): 0 10*3/uL (ref 0.0–0.1)
Lymphocytes Absolute: 1.4 10*3/uL (ref 0.7–3.1)
Lymphs: 18 %
MCH: 29.5 pg (ref 26.6–33.0)
MCHC: 34.4 g/dL (ref 31.5–35.7)
MCV: 86 fL (ref 79–97)
MONOCYTES: 11 %
Monocytes Absolute: 0.8 10*3/uL (ref 0.1–0.9)
NEUTROS PCT: 67 %
Neutrophils Absolute: 5.3 10*3/uL (ref 1.4–7.0)
Platelets: 347 10*3/uL (ref 150–379)
RBC: 4.68 x10E6/uL (ref 3.77–5.28)
RDW: 13.7 % (ref 12.3–15.4)
WBC: 7.7 10*3/uL (ref 3.4–10.8)

## 2017-11-06 LAB — COMPREHENSIVE METABOLIC PANEL
ALBUMIN: 4.3 g/dL (ref 3.5–5.5)
ALT: 15 IU/L (ref 0–32)
AST: 16 IU/L (ref 0–40)
Albumin/Globulin Ratio: 1.6 (ref 1.2–2.2)
Alkaline Phosphatase: 59 IU/L (ref 39–117)
BUN/Creatinine Ratio: 13 (ref 9–23)
BUN: 11 mg/dL (ref 6–20)
Bilirubin Total: 0.4 mg/dL (ref 0.0–1.2)
CALCIUM: 9.6 mg/dL (ref 8.7–10.2)
CO2: 19 mmol/L — AB (ref 20–29)
CREATININE: 0.88 mg/dL (ref 0.57–1.00)
Chloride: 106 mmol/L (ref 96–106)
GFR calc Af Amer: 103 mL/min/{1.73_m2} (ref >59)
GFR, EST NON AFRICAN AMERICAN: 90 mL/min/{1.73_m2} (ref >59)
GLOBULIN, TOTAL: 2.7 g/dL (ref 1.5–4.5)
GLUCOSE: 91 mg/dL (ref 65–99)
Potassium: 4.3 mmol/L (ref 3.5–5.2)
SODIUM: 141 mmol/L (ref 134–144)
Total Protein: 7 g/dL (ref 6.0–8.5)

## 2017-11-06 LAB — PAP IG, CT-NG, RFX HPV ASCU
CHLAMYDIA, NUC. ACID AMP: NEGATIVE
GONOCOCCUS BY NUCLEIC ACID AMP: NEGATIVE
PAP Smear Comment: 0

## 2017-11-06 LAB — TSH: TSH: 4.98 u[IU]/mL — ABNORMAL HIGH (ref 0.450–4.500)

## 2017-11-06 LAB — LIPID PANEL
CHOLESTEROL TOTAL: 149 mg/dL (ref 100–199)
Chol/HDL Ratio: 4.1 ratio (ref 0.0–4.4)
HDL: 36 mg/dL — AB (ref >39)
LDL CALC: 91 mg/dL (ref 0–99)
TRIGLYCERIDES: 110 mg/dL (ref 0–149)
VLDL CHOLESTEROL CAL: 22 mg/dL (ref 5–40)

## 2017-11-06 LAB — 17-HYDROXYPROGESTERONE: 17-Hydroxyprogesterone: 116 ng/dL

## 2017-11-06 LAB — HEMOGLOBIN A1C
ESTIMATED AVERAGE GLUCOSE: 114 mg/dL
Hgb A1c MFr Bld: 5.6 % (ref 4.8–5.6)

## 2017-11-06 LAB — TESTOSTERONE: Testosterone: 25 ng/dL (ref 8–48)

## 2017-11-06 LAB — SEX HORMONE BINDING GLOBULIN: SEX HORMONE BINDING: 30.2 nmol/L (ref 24.6–122.0)

## 2017-11-06 NOTE — Telephone Encounter (Signed)
LMTCB  Thanks,  -Larrie Lucia 

## 2017-11-06 NOTE — Telephone Encounter (Signed)
-----   Message from Margaretann LovelessJennifer M Burnette, [place]PA-C sent at 11/06/2017  4:53 PM EDT ----- Labs are normal, all hormones are normal, not indicative of PCOS. Pap is normal, GC/chlamydia negative. Repeat pap in 3 years.

## 2017-11-09 NOTE — Telephone Encounter (Signed)
na

## 2017-11-10 NOTE — Telephone Encounter (Signed)
Patient advised as directed below.  Thanks,  -Joseline 

## 2017-11-11 ENCOUNTER — Encounter: Payer: Self-pay | Admitting: Physician Assistant

## 2017-11-11 ENCOUNTER — Ambulatory Visit: Payer: 59 | Admitting: Physician Assistant

## 2017-11-11 ENCOUNTER — Telehealth: Payer: Self-pay | Admitting: Physician Assistant

## 2017-11-11 VITALS — BP 120/80 | HR 111 | Temp 98.2°F | Resp 16 | Wt 255.4 lb

## 2017-11-11 DIAGNOSIS — Z0289 Encounter for other administrative examinations: Secondary | ICD-10-CM | POA: Diagnosis not present

## 2017-11-11 DIAGNOSIS — G8929 Other chronic pain: Secondary | ICD-10-CM

## 2017-11-11 DIAGNOSIS — M47816 Spondylosis without myelopathy or radiculopathy, lumbar region: Secondary | ICD-10-CM

## 2017-11-11 DIAGNOSIS — M5126 Other intervertebral disc displacement, lumbar region: Secondary | ICD-10-CM

## 2017-11-11 DIAGNOSIS — M5442 Lumbago with sciatica, left side: Secondary | ICD-10-CM | POA: Diagnosis not present

## 2017-11-11 DIAGNOSIS — G894 Chronic pain syndrome: Secondary | ICD-10-CM

## 2017-11-11 DIAGNOSIS — M5416 Radiculopathy, lumbar region: Secondary | ICD-10-CM

## 2017-11-11 MED ORDER — OXYCODONE HCL 5 MG PO CAPS: 5.0000 mg | ORAL_CAPSULE | ORAL | 0 refills | Status: DC | PRN
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
## 2017-11-11 NOTE — Telephone Encounter (Signed)
Pt called saying the pharmacy needs a PA on her oxycodone 5mg   [facility] [address]   Pt's call back [phone]  Thanks Barth Kirksteri

## 2017-11-11 NOTE — Patient Instructions (Signed)
Opioid Pain Medicine Information Opioids are powerful medicines that are used to treat moderate to severe pain. Opioids should be taken with the supervision of a trained health care provider. They should be taken for the shortest period of time as possible. This is because opioids can be addictive and the longer you take opioids, the greater your risk of addiction (opioid use disorder). What do opioids do? Opioids help to reduce or eliminate pain. When used for short periods of time, they can help you:  Sleep better.  Do better in physical or occupational therapy.  Feel better in the first few days after an injury.  Recover from surgery.  What is a pain treatment plan? A pain treatment plan is an agreement between you and your health care provider. Pain is unique to each person, and treatments vary depending on your condition. To manage your pain successfully, you and your health care provider need to understand each other and work together. To help you do this:  Discuss the goals of your treatment, including how much pain you might expect to have and how you will manage the pain.  Review the risks and benefits of taking opioid medicines for your condition.  Remember that a good treatment plan uses more than one approach and minimizes the chance of side effects.  Be honest about the amount of medicines you take, and about any drug or alcohol use.  Get pain medicine prescriptions from only one care provider.  Keep all follow-up visits as told by your health care provider. This is important.  What instructions should I follow while taking opioid pain medicine? While you are taking the medicine and for 8 hours after you stop taking the medicine, follow these instructions:  Do not drive.  Do not use machinery or power tools.  Do not sign legal documents.  Do not drink alcohol.  Do not take sleeping pills.  Do not supervise children by yourself.  Do not participate in activities  that require climbing or being in high places.  Do not enter a body of water-such as a lake, river, ocean, spa, or swimming pool-unless an adult is nearby who can monitor and help you.  What kinds of side effects can opioids cause? Opioids can cause side effects, such as:  Constipation.  Nausea.  Vomiting.  Drowsiness.  Confusion.  Opioid use disorder.  Breathing difficulties (respiratory depression).  Using opioid pain medicines for longer than 3 days increases your risk of these side effects. Taking opioid pain medicine for a long period of time can affect your ability to do daily tasks. It also puts you at risk for:  Motor vehicle accidents.  Depression.  Suicide.  Heart attack.  Overdose, which can sometimes lead to death.  What are alternative ways to manage pain? Pain can be managed with many types of alternative treatments. Ask your health care provider to refer you to one or more specialists who can help you manage pain through:  Physical or occupational therapy.  Counseling (cognitive behavioral therapy).  Good nutrition.  Biofeedback.  Massage.  Meditation.  Non-opioid medicine.  Following a gentle exercise program.  How can I keep others safe while I am taking opioid pain medicine?  Keep pain medicine in a locked cabinet, or in a secure area where children cannot reach it.  Never share your pain medicine with anyone.  Do not save any leftover pills. If you have leftover medicine, you can: 1. Bring the medicine to a prescription take-back program.   This is usually offered by the county or law enforcement. 2. Throw it out in the trash. To do this:  Mix the medicine with undesirable trash such as pet waste or food.  Put the mixture in a sealed container or plastic bag.  Throw it in the trash.  Destroy any personal information on the prescription bottle. How do I stop taking opioids if I have been taking them for a long time? If you have  been taking opioid medicine for more than a few weeks, you may need to slowly decrease (taper) how much you take until you stop completely. Tapering your use of opioids can decrease your chances of experiencing withdrawal symptoms, such as:  Pain and cramping in the abdomen.  Nausea.  Sweating.  Sleepiness.  Restlessness.  Uncontrollable shaking (tremors).  Cravings for the medicine.  Do not attempt to taper your use of opioids on your own. Talk with your health care provider about how to do this. Your health care provider may prescribe a step-down schedule based on how much medicine you are taking and how long you have been taking it. Where to find support: If you have been taking opioids for a long time, you may benefit from receiving support for quitting from a local support group or counselor. Ask your health care provider for a referral to these resources in your area. Where to find more information:  Centers for Disease Control and Prevention (CDC): www.cdc.gov/drugoverdose/opioids/index.html Get help right away if: Seek medical care right away if you are taking opioids and you (or people close to you) notice any of the following:  Difficulty breathing.  Breathing that is slower or more shallow than normal.  A very slow heartbeat (pulse).  Severe confusion.  Unconsciousness.  Sleepiness.  Slurred speech.  Nausea and vomiting.  Cold, clammy skin.  Blue lips or fingernails.  Limpness.  Abnormally small pupils.  If you think that you or someone else may have taken too much of an opioid medicine, get medical help right away. Do not wait to see if the symptoms go away on their own.  If you ever feel like you may hurt yourself or others, or have thoughts about taking your own life, get help right away. You can go to your nearest emergency department or call:  Your local emergency services (911 in the [place]  The hotline of the National Poison Control Center  (1-800-222-1222 in the [place]  A suicide crisis helpline, such as the National Suicide Prevention Lifeline at 1-800-273-8255. This is open 24 hours a day.  Summary  Opioid medicines can help you manage moderate to severe pain for a short period of time.  Discuss the goals of your treatment with your health care provider, including how much pain you might expect to have and how you will manage the pain.  A good treatment plan uses more than one approach. Pain can be managed with many types of alternative treatments.  If you think that you or someone else may have taken too much of an opioid, get medical help right away. This information is not intended to replace advice given to you by your health care provider. Make sure you discuss any questions you have with your health care provider. Document Released: 08/24/2015 Document Revised: 11/14/2016 Document Reviewed: 03/09/2015 Elsevier Interactive Patient Education  2018 Elsevier Inc.  

## 2017-11-13 ENCOUNTER — Telehealth: Payer: Self-pay

## 2017-11-13 LAB — PAIN MGT SCRN (14 DRUGS), UR
AMPHETAMINE SCREEN URINE: NEGATIVE ng/mL
BARBITURATE SCREEN URINE: NEGATIVE ng/mL
BENZODIAZEPINE SCREEN, URINE: POSITIVE ng/mL — AB
Buprenorphine, Urine: NEGATIVE ng/mL
CANNABINOIDS UR QL SCN: NEGATIVE ng/mL
CREATININE(CRT), U: 74.3 mg/dL (ref 20.0–300.0)
Cocaine (Metab) Scrn, Ur: NEGATIVE ng/mL
Fentanyl, Urine: NEGATIVE pg/mL
METHADONE SCREEN, URINE: NEGATIVE ng/mL
Meperidine Screen, Urine: NEGATIVE ng/mL
OXYCODONE+OXYMORPHONE UR QL SCN: NEGATIVE ng/mL
Opiate Scrn, Ur: NEGATIVE ng/mL
PHENCYCLIDINE QUANTITATIVE URINE: NEGATIVE ng/mL
Ph of Urine: 7.8 (ref 4.5–8.9)
Propoxyphene Scrn, Ur: NEGATIVE ng/mL
TRAMADOL SCREEN, URINE: NEGATIVE ng/mL

## 2017-11-13 NOTE — Telephone Encounter (Signed)
Message and labs viewed by patient in mychart. Viewed by Golden PopKatherine Teresa Breuer on 11/13/2017 8:59 AM

## 2017-11-13 NOTE — Telephone Encounter (Signed)
-----   Message from Margaretann LovelessJennifer M Burnette, PA-C sent at 11/13/2017  8:58 AM EDT ----- Urine is normal and as expected.

## 2017-11-13 NOTE — Telephone Encounter (Signed)
PA on the oxycodone 5mg  was approved today. Called the pharmacy and per pharmacist patient pick it up yesterday and paid out pocket $50 dollars. Try calling patient but NA/Unable to LM.  Thanks,  -Yomira Flitton

## 2017-12-08 ENCOUNTER — Other Ambulatory Visit: Payer: Self-pay | Admitting: Physician Assistant

## 2017-12-08 DIAGNOSIS — M5442 Lumbago with sciatica, left side: Secondary | ICD-10-CM

## 2017-12-08 DIAGNOSIS — M47816 Spondylosis without myelopathy or radiculopathy, lumbar region: Secondary | ICD-10-CM

## 2017-12-08 DIAGNOSIS — M5126 Other intervertebral disc displacement, lumbar region: Secondary | ICD-10-CM

## 2017-12-08 DIAGNOSIS — G8929 Other chronic pain: Secondary | ICD-10-CM

## 2017-12-08 DIAGNOSIS — G894 Chronic pain syndrome: Secondary | ICD-10-CM

## 2017-12-08 DIAGNOSIS — M5416 Radiculopathy, lumbar region: Secondary | ICD-10-CM

## 2017-12-08 MED ORDER — OXYCODONE HCL 5 MG PO CAPS: 5.0000 mg | ORAL_CAPSULE | ORAL | 0 refills | Status: DC | PRN

## 2017-12-08 NOTE — Telephone Encounter (Signed)
NCCSR reviewed. Rx sent in. 

## 2017-12-08 NOTE — Telephone Encounter (Signed)
Pt contacted office for refill request on the following medications:  oxycodone (OXY-IR) 5 MG capsule  [facility] [address]  Last Rx: 11/11/17 LOV: 11/11/17 Please advise. Thanks TNP

## 2018-01-01 ENCOUNTER — Other Ambulatory Visit: Payer: Self-pay | Admitting: Physician Assistant

## 2018-01-01 DIAGNOSIS — M5416 Radiculopathy, lumbar region: Secondary | ICD-10-CM

## 2018-01-01 DIAGNOSIS — M47816 Spondylosis without myelopathy or radiculopathy, lumbar region: Secondary | ICD-10-CM

## 2018-01-01 DIAGNOSIS — G894 Chronic pain syndrome: Secondary | ICD-10-CM

## 2018-01-01 DIAGNOSIS — G8929 Other chronic pain: Secondary | ICD-10-CM

## 2018-01-01 DIAGNOSIS — M5126 Other intervertebral disc displacement, lumbar region: Secondary | ICD-10-CM

## 2018-01-01 DIAGNOSIS — M5442 Lumbago with sciatica, left side: Secondary | ICD-10-CM

## 2018-01-01 MED ORDER — OXYCODONE HCL 5 MG PO CAPS: 5.0000 mg | ORAL_CAPSULE | ORAL | 0 refills | Status: DC | PRN

## 2018-01-01 NOTE — Telephone Encounter (Signed)
NCCSR reviewed. Rx sent in. 

## 2018-01-01 NOTE — Telephone Encounter (Signed)
Patient said she is due for a refill on the Oxycodone on Monday. She knows she cant get it early.  Would like to have this called in today so she can pick it up on Monday.  [facility] [address]

## 2018-01-05 ENCOUNTER — Emergency Department
Admission: EM | Admit: 2018-01-05 | Discharge: 2018-01-05 | Disposition: A | Discharge status: Home / Self Care | Payer: No Typology Code available for payment source | Attending: Emergency Medicine | Admitting: Emergency Medicine

## 2018-01-05 ENCOUNTER — Other Ambulatory Visit: Payer: Self-pay

## 2018-01-05 ENCOUNTER — Encounter: Payer: Self-pay | Admitting: Emergency Medicine

## 2018-01-05 DIAGNOSIS — Z711 Person with feared health complaint in whom no diagnosis is made: Secondary | ICD-10-CM | POA: Insufficient documentation

## 2018-01-05 DIAGNOSIS — R42 Dizziness and giddiness: Secondary | ICD-10-CM | POA: Insufficient documentation

## 2018-01-05 DIAGNOSIS — Z7729 Contact with and (suspected ) exposure to other hazardous substances: Secondary | ICD-10-CM | POA: Diagnosis present

## 2018-01-05 DIAGNOSIS — Z79899 Other long term (current) drug therapy: Secondary | ICD-10-CM | POA: Insufficient documentation

## 2018-01-05 DIAGNOSIS — R51 Headache: Secondary | ICD-10-CM | POA: Insufficient documentation

## 2018-01-05 DIAGNOSIS — R110 Nausea: Secondary | ICD-10-CM | POA: Diagnosis not present

## 2018-01-05 LAB — BASIC METABOLIC PANEL
Anion gap: 10 (ref 5–15)
BUN: 16 mg/dL (ref 6–20)
CO2: 25 mmol/L (ref 22–32)
Calcium: 9.2 mg/dL (ref 8.9–10.3)
Chloride: 103 mmol/L (ref 101–111)
Creatinine, Ser: 0.9 mg/dL (ref 0.44–1.00)
GFR calc Af Amer: >60 mL/min (ref >60)
GLUCOSE: 94 mg/dL (ref 65–99)
POTASSIUM: 4.3 mmol/L (ref 3.5–5.1)
Sodium: 138 mmol/L (ref 135–145)

## 2018-01-05 LAB — CBC
HEMATOCRIT: 39.3 % (ref 35.0–47.0)
Hemoglobin: 13.2 g/dL (ref 12.0–16.0)
MCH: 30.3 pg (ref 26.0–34.0)
MCHC: 33.5 g/dL (ref 32.0–36.0)
MCV: 90.4 fL (ref 80.0–100.0)
Platelets: 359 10*3/uL (ref 150–440)
RBC: 4.35 MIL/uL (ref 3.80–5.20)
RDW: 13.1 % (ref 11.5–14.5)
WBC: 8.5 10*3/uL (ref 3.6–11.0)

## 2018-01-05 LAB — COOXEMETRY PANEL
CARBOXYHEMOGLOBIN: 2.5 % — AB (ref 0.5–1.5)
Methemoglobin: 0.6 % (ref 0.0–1.5)
O2 Saturation: 56.4 %
Total oxygen content: UNDETERMINED mL/dL

## 2018-01-05 NOTE — ED Notes (Signed)
See triage note  Presents with slight headache and dizziness since being exposed to gas at work  Positive nausea  Vomited times 1
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
## 2018-01-05 NOTE — ED Notes (Signed)
Colleen Cook HR at [employer] says no uds needed for wc 
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
## 2018-01-05 NOTE — ED Notes (Signed)
FIRST NURSE NOTE:  Pt working at [employer], had gas leak, concerned for CO poisoning. Here with supervisor.  NAD noted.

## 2018-01-05 NOTE — ED Notes (Signed)
D/w Dr. Roxan Hockey, received new order for carboxy hemoglobin.
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
## 2018-01-29 ENCOUNTER — Other Ambulatory Visit: Payer: Self-pay | Admitting: Physician Assistant

## 2018-01-29 DIAGNOSIS — G8929 Other chronic pain: Secondary | ICD-10-CM

## 2018-01-29 DIAGNOSIS — G894 Chronic pain syndrome: Secondary | ICD-10-CM

## 2018-01-29 DIAGNOSIS — M5126 Other intervertebral disc displacement, lumbar region: Secondary | ICD-10-CM

## 2018-01-29 DIAGNOSIS — M47816 Spondylosis without myelopathy or radiculopathy, lumbar region: Secondary | ICD-10-CM

## 2018-01-29 DIAGNOSIS — M5416 Radiculopathy, lumbar region: Secondary | ICD-10-CM

## 2018-01-29 DIAGNOSIS — M5442 Lumbago with sciatica, left side: Secondary | ICD-10-CM

## 2018-01-29 MED ORDER — OXYCODONE HCL 5 MG PO CAPS: 5.0000 mg | ORAL_CAPSULE | ORAL | 0 refills | Status: DC | PRN

## 2018-01-29 NOTE — Telephone Encounter (Signed)
NCCSR reviewed. 

## 2018-01-29 NOTE — Telephone Encounter (Signed)
Pt needs refill on her oxycodone 5 mg  She thinks ins will cover on the 23rd.  [facility] [address] [facility] [place]  PT's CB [phone]  Thanks Barth Kirksteri

## 2018-02-24 ENCOUNTER — Other Ambulatory Visit: Payer: Self-pay | Admitting: Physician Assistant

## 2018-02-24 DIAGNOSIS — M5442 Lumbago with sciatica, left side: Secondary | ICD-10-CM

## 2018-02-24 DIAGNOSIS — M5126 Other intervertebral disc displacement, lumbar region: Secondary | ICD-10-CM

## 2018-02-24 DIAGNOSIS — M47816 Spondylosis without myelopathy or radiculopathy, lumbar region: Secondary | ICD-10-CM

## 2018-02-24 DIAGNOSIS — M5416 Radiculopathy, lumbar region: Secondary | ICD-10-CM

## 2018-02-24 DIAGNOSIS — G8929 Other chronic pain: Secondary | ICD-10-CM

## 2018-02-24 DIAGNOSIS — G894 Chronic pain syndrome: Secondary | ICD-10-CM

## 2018-02-24 NOTE — Telephone Encounter (Signed)
Pt contacted office for refill request on the following medications:  oxycodone (OXY-IR) 5 MG capsule  [facility] [place]  Last Rx: 01/29/18 LOV: 11/11/17 Please advise. Thanks TNP

## 2018-02-25 MED ORDER — OXYCODONE HCL 5 MG PO CAPS: 5.0000 mg | ORAL_CAPSULE | ORAL | 0 refills | Status: DC | PRN

## 2018-02-25 NOTE — Telephone Encounter (Signed)
NCCSR reviewed. 

## 2018-03-24 ENCOUNTER — Other Ambulatory Visit: Payer: Self-pay

## 2018-03-24 DIAGNOSIS — M5416 Radiculopathy, lumbar region: Secondary | ICD-10-CM

## 2018-03-24 DIAGNOSIS — M47816 Spondylosis without myelopathy or radiculopathy, lumbar region: Secondary | ICD-10-CM

## 2018-03-24 DIAGNOSIS — G8929 Other chronic pain: Secondary | ICD-10-CM

## 2018-03-24 DIAGNOSIS — M5442 Lumbago with sciatica, left side: Secondary | ICD-10-CM

## 2018-03-24 DIAGNOSIS — M5126 Other intervertebral disc displacement, lumbar region: Secondary | ICD-10-CM

## 2018-03-24 DIAGNOSIS — G894 Chronic pain syndrome: Secondary | ICD-10-CM

## 2018-03-24 MED ORDER — OXYCODONE HCL 5 MG PO CAPS: 5.0000 mg | ORAL_CAPSULE | ORAL | 0 refills | Status: DC | PRN

## 2018-03-24 NOTE — Telephone Encounter (Signed)
Patient requesting refill request [facility] [address]

## 2018-03-24 NOTE — Telephone Encounter (Signed)
NCCSR reviewed. 

## 2018-04-05 ENCOUNTER — Telehealth: Payer: Self-pay | Admitting: Physician Assistant

## 2018-04-05 NOTE — Telephone Encounter (Signed)
Dr Doylene Canardonner called wanting to discuss and double check on which medications you are prescribing and tell you which ones that he is prescribing.  His call back is   [phone]  Thanks teri

## 2018-04-05 NOTE — Telephone Encounter (Signed)
Called and left VM for Dr. Eustaquio Boydenonnors.

## 2018-04-05 NOTE — Telephone Encounter (Signed)
Got message from Dr. Eustaquio Boydenonnors making sure we were both ok co-prescribing medications. He prescribes her clonazepam she has been on for years and I am prescribing her oxycodone-IR for back pain. She has been on both for approx. 4-5 months without issue. She also is aware to not take at the same time. We are both ok continuing prescribing our respective medications.

## 2018-04-21 ENCOUNTER — Other Ambulatory Visit: Payer: Self-pay | Admitting: Physician Assistant

## 2018-04-21 DIAGNOSIS — M5416 Radiculopathy, lumbar region: Secondary | ICD-10-CM

## 2018-04-21 DIAGNOSIS — M47816 Spondylosis without myelopathy or radiculopathy, lumbar region: Secondary | ICD-10-CM

## 2018-04-21 DIAGNOSIS — M5442 Lumbago with sciatica, left side: Secondary | ICD-10-CM

## 2018-04-21 DIAGNOSIS — M5126 Other intervertebral disc displacement, lumbar region: Secondary | ICD-10-CM

## 2018-04-21 DIAGNOSIS — G8929 Other chronic pain: Secondary | ICD-10-CM

## 2018-04-21 DIAGNOSIS — G894 Chronic pain syndrome: Secondary | ICD-10-CM

## 2018-04-21 MED ORDER — OXYCODONE HCL 5 MG PO CAPS: 5.0000 mg | ORAL_CAPSULE | ORAL | 0 refills | Status: DC | PRN

## 2018-04-21 NOTE — Telephone Encounter (Signed)
Pt contacted office for refill request on the following medications:  oxycodone (OXY-IR) 5 MG capsule   [facility] [address]  Last Rx: 03/24/18 LOV: 11/11/17 Please advise. Thanks TNP

## 2018-04-21 NOTE — Telephone Encounter (Signed)
NCCSR reviewed. 

## 2018-05-13 ENCOUNTER — Ambulatory Visit: Payer: 59 | Admitting: Physician Assistant

## 2018-05-13 ENCOUNTER — Encounter: Payer: Self-pay | Admitting: Physician Assistant

## 2018-05-13 VITALS — BP 130/87 | HR 114 | Temp 97.8°F | Resp 16 | Wt 228.6 lb

## 2018-05-13 DIAGNOSIS — Z3009 Encounter for other general counseling and advice on contraception: Secondary | ICD-10-CM | POA: Diagnosis not present

## 2018-05-13 LAB — POCT URINE PREGNANCY: PREG TEST UR: NEGATIVE

## 2018-05-13 MED ORDER — NORGESTIM-ETH ESTRAD TRIPHASIC 0.18/0.215/0.25 MG-25 MCG PO TABS: 1.0000 | ORAL_TABLET | Freq: Every day | ORAL | 11 refills | Status: DC

## 2018-05-13 NOTE — Progress Notes (Signed)
Patient: Colleen Cook Female    DOB: [birth year]   27 y.o.   MRN: [id number] Visit Date: 05/13/2018  Today's Provider: [name], PA-C   Chief Complaint  Patient presents with  . Follow-up    Chronic Pain   Subjective:    HPI Patient here to follow-up on Chronic Pain. She reports that the Oxycodone has helped and her exercises that she is doing.  Contraception Counseling: Patient presents for contraception counseling. The patient has no complaints today. The patient is not sexually active. Pertinent past medical history: none. LMP:04/16/18   No Known Allergies   Current Outpatient Medications:  .  clonazePAM (KLONOPIN) 0.5 MG tablet, Take 0.5 mg by mouth 2 (two) times daily as needed., Disp: , Rfl: 1 .  Melatonin 3 MG TABS, Take 2 tablets (6 mg total) by mouth at bedtime as needed., Disp: 60 tablet, Rfl: 0 .  ondansetron (ZOFRAN ODT) 4 MG disintegrating tablet, Take 1 tablet (4 mg total) by mouth every 8 (eight) hours as needed for nausea or vomiting., Disp: 20 tablet, Rfl: 0 .  oxycodone (OXY-IR) 5 MG capsule, Take 1 capsule (5 mg total) by mouth every 4 (four) hours as needed., Disp: 120 capsule, Rfl: 0 .  sertraline (ZOLOFT) 50 MG tablet, Take 50 mg by mouth daily., Disp: , Rfl:  .  ibuprofen (ADVIL,MOTRIN) 200 MG tablet, Take 800 mg by mouth every 8 (eight) hours as needed., Disp: , Rfl:  .  ibuprofen (ADVIL,MOTRIN) 800 MG tablet, , Disp: , Rfl:   Review of Systems  Constitutional: Negative.   Respiratory: Negative.   Cardiovascular: Negative.   Gastrointestinal: Negative.   Genitourinary: Negative.   Psychiatric/Behavioral: Negative.     Social History   Tobacco Use  . Smoking status: Never Smoker  . Smokeless tobacco: Never Used  Substance Use Topics  . Alcohol use: No   Objective:   BP 130/87 (BP Location: Left Arm, Patient Position: Sitting, Cuff Size: Large)   Pulse (!) 114   Temp 97.8 F (36.6 C) (Oral)   Resp 16   Wt 228  lb 9.6 oz (103.7 kg)   LMP 04/16/2018   SpO2 97%   BMI 34.76 kg/m  Vitals:   05/13/18 0802  BP: 130/87  Pulse: (!) 114  Resp: 16  Temp: 97.8 F (36.6 C)  TempSrc: Oral  SpO2: 97%  Weight: 228 lb 9.6 oz (103.7 kg)     Physical Exam  Constitutional: She appears well-developed and well-nourished. No distress.  Neck: Normal range of motion. Neck supple.  Cardiovascular: Normal rate, regular rhythm and normal heart sounds. Exam reveals no gallop and no friction rub.  No murmur heard. Pulmonary/Chest: Effort normal and breath sounds normal. No respiratory distress. She has no wheezes. She has no rales.  Skin: She is not diaphoretic.  Vitals reviewed.       Assessment & Plan:     1. Encounter for counseling regarding contraception Urine pregnancy negative. Will start OCP as below. Patient is interested in possibly IUD placement. She will schedule to see Dr. Beryle Flock to discuss and possibly have IUD inserted in 4-6 weeks. If she changes her mind and decides to stay with OCP she is to call and cancel.  - POCT urine pregnancy - Norgestimate-Ethinyl Estradiol Triphasic (ORTHO TRI-CYCLEN LO) 0.18/0.215/0.25 MG-25 MCG tab; Take 1 tablet by mouth daily.  Dispense: 1 Package; Refill: [address]       [name], PA-C  [employer]  [facility]

## 2018-05-14 ENCOUNTER — Encounter: Payer: Self-pay | Admitting: Physician Assistant

## 2018-05-17 ENCOUNTER — Other Ambulatory Visit: Payer: Self-pay | Admitting: Physician Assistant

## 2018-05-17 DIAGNOSIS — M47816 Spondylosis without myelopathy or radiculopathy, lumbar region: Secondary | ICD-10-CM

## 2018-05-17 DIAGNOSIS — M5126 Other intervertebral disc displacement, lumbar region: Secondary | ICD-10-CM

## 2018-05-17 DIAGNOSIS — G894 Chronic pain syndrome: Secondary | ICD-10-CM

## 2018-05-17 DIAGNOSIS — G8929 Other chronic pain: Secondary | ICD-10-CM

## 2018-05-17 DIAGNOSIS — M5442 Lumbago with sciatica, left side: Secondary | ICD-10-CM

## 2018-05-17 DIAGNOSIS — M5416 Radiculopathy, lumbar region: Secondary | ICD-10-CM

## 2018-05-17 MED ORDER — OXYCODONE HCL 5 MG PO CAPS: 5.0000 mg | ORAL_CAPSULE | ORAL | 0 refills | Status: DC | PRN

## 2018-05-17 NOTE — Telephone Encounter (Signed)
Pt contacted office for refill request on the following medications:  oxycodone (OXY-IR) 5 MG capsule   [facility]  Last Rx: 04/21/18 LOV: 05/13/18 Pt stated she has 1 day of medication left. Please advise. Thanks TNP

## 2018-05-20 ENCOUNTER — Encounter: Payer: Self-pay | Admitting: Physician Assistant

## 2018-05-26 ENCOUNTER — Encounter: Payer: Self-pay | Admitting: Physician Assistant

## 2018-05-28 ENCOUNTER — Encounter: Payer: Self-pay | Admitting: Physician Assistant

## 2018-05-28 DIAGNOSIS — N3000 Acute cystitis without hematuria: Secondary | ICD-10-CM

## 2018-05-31 MED ORDER — SULFAMETHOXAZOLE-TRIMETHOPRIM 800-160 MG PO TABS: 1.0000 | ORAL_TABLET | Freq: Two times a day (BID) | ORAL | 0 refills | Status: DC

## 2018-05-31 NOTE — Addendum Note (Signed)
Addended by: [name] on: 05/31/2018 12:11 PM   Modules accepted: Orders

## 2018-06-10 ENCOUNTER — Encounter: Payer: Self-pay | Admitting: Physician Assistant

## 2018-06-13 ENCOUNTER — Other Ambulatory Visit: Payer: Self-pay | Admitting: Physician Assistant

## 2018-06-13 DIAGNOSIS — M5126 Other intervertebral disc displacement, lumbar region: Secondary | ICD-10-CM

## 2018-06-13 DIAGNOSIS — M47816 Spondylosis without myelopathy or radiculopathy, lumbar region: Secondary | ICD-10-CM

## 2018-06-13 DIAGNOSIS — G894 Chronic pain syndrome: Secondary | ICD-10-CM

## 2018-06-13 DIAGNOSIS — M5442 Lumbago with sciatica, left side: Secondary | ICD-10-CM

## 2018-06-13 DIAGNOSIS — M5416 Radiculopathy, lumbar region: Secondary | ICD-10-CM

## 2018-06-13 DIAGNOSIS — G8929 Other chronic pain: Secondary | ICD-10-CM

## 2018-06-14 ENCOUNTER — Other Ambulatory Visit: Payer: Self-pay | Admitting: Physician Assistant

## 2018-06-14 DIAGNOSIS — M5126 Other intervertebral disc displacement, lumbar region: Secondary | ICD-10-CM

## 2018-06-14 DIAGNOSIS — G894 Chronic pain syndrome: Secondary | ICD-10-CM

## 2018-06-14 DIAGNOSIS — G8929 Other chronic pain: Secondary | ICD-10-CM

## 2018-06-14 DIAGNOSIS — M5416 Radiculopathy, lumbar region: Secondary | ICD-10-CM

## 2018-06-14 DIAGNOSIS — M5442 Lumbago with sciatica, left side: Secondary | ICD-10-CM

## 2018-06-14 DIAGNOSIS — M47816 Spondylosis without myelopathy or radiculopathy, lumbar region: Secondary | ICD-10-CM

## 2018-06-14 MED ORDER — OXYCODONE HCL 5 MG PO CAPS: 5.0000 mg | ORAL_CAPSULE | ORAL | 0 refills | Status: DC | PRN

## 2018-06-14 NOTE — Telephone Encounter (Signed)
Pt needing refill on:  oxycodone (OXY-IR) 5 MG capsule  Please fill at:   [id number] [place] [place] - [address] [phone] (Phone) [phone] (Fax)    Thanks, [employer]

## 2018-06-15 ENCOUNTER — Ambulatory Visit: Payer: Self-pay | Admitting: Family Medicine

## 2018-06-21 ENCOUNTER — Encounter: Payer: Self-pay | Admitting: Physician Assistant

## 2018-07-07 ENCOUNTER — Other Ambulatory Visit: Payer: Self-pay

## 2018-07-07 DIAGNOSIS — G894 Chronic pain syndrome: Secondary | ICD-10-CM

## 2018-07-07 DIAGNOSIS — G8929 Other chronic pain: Secondary | ICD-10-CM

## 2018-07-07 DIAGNOSIS — M47816 Spondylosis without myelopathy or radiculopathy, lumbar region: Secondary | ICD-10-CM

## 2018-07-07 DIAGNOSIS — M5126 Other intervertebral disc displacement, lumbar region: Secondary | ICD-10-CM

## 2018-07-07 DIAGNOSIS — M5416 Radiculopathy, lumbar region: Secondary | ICD-10-CM

## 2018-07-07 DIAGNOSIS — M5442 Lumbago with sciatica, left side: Secondary | ICD-10-CM

## 2018-07-07 MED ORDER — OXYCODONE HCL 5 MG PO CAPS: 5.0000 mg | ORAL_CAPSULE | ORAL | 0 refills | Status: DC | PRN

## 2018-07-07 NOTE — Telephone Encounter (Signed)
NCCSR reviewed. 

## 2018-07-28 ENCOUNTER — Other Ambulatory Visit: Payer: Self-pay | Admitting: Physician Assistant

## 2018-07-28 DIAGNOSIS — M5416 Radiculopathy, lumbar region: Secondary | ICD-10-CM

## 2018-07-28 DIAGNOSIS — G8929 Other chronic pain: Secondary | ICD-10-CM

## 2018-07-28 DIAGNOSIS — M5126 Other intervertebral disc displacement, lumbar region: Secondary | ICD-10-CM

## 2018-07-28 DIAGNOSIS — M5442 Lumbago with sciatica, left side: Secondary | ICD-10-CM

## 2018-07-28 DIAGNOSIS — G894 Chronic pain syndrome: Secondary | ICD-10-CM

## 2018-07-28 DIAGNOSIS — M47816 Spondylosis without myelopathy or radiculopathy, lumbar region: Secondary | ICD-10-CM

## 2018-07-28 NOTE — Telephone Encounter (Signed)
Patient is asking for a refill on Oxycodone 5 mg. [facility] [place].

## 2018-07-28 NOTE — Telephone Encounter (Signed)
She is requesting this a week too early. Last refill was on 07/07/18, so not due until then.   JB

## 2018-07-29 ENCOUNTER — Other Ambulatory Visit: Payer: Self-pay | Admitting: Physician Assistant

## 2018-07-29 DIAGNOSIS — M5442 Lumbago with sciatica, left side: Secondary | ICD-10-CM

## 2018-07-29 DIAGNOSIS — M5416 Radiculopathy, lumbar region: Secondary | ICD-10-CM

## 2018-07-29 DIAGNOSIS — M5126 Other intervertebral disc displacement, lumbar region: Secondary | ICD-10-CM

## 2018-07-29 DIAGNOSIS — M47816 Spondylosis without myelopathy or radiculopathy, lumbar region: Secondary | ICD-10-CM

## 2018-07-29 DIAGNOSIS — G8929 Other chronic pain: Secondary | ICD-10-CM

## 2018-07-29 DIAGNOSIS — G894 Chronic pain syndrome: Secondary | ICD-10-CM

## 2018-07-29 NOTE — Telephone Encounter (Signed)
Pt needing a refill on: Norgestimate-Ethinyl Estradiol Triphasic (ORTHO TRI-CYCLEN LO) 0.18/0.215/0.25 MG-25 MCG tab  Please fill at:  [id number] [place], [place] - [address] [phone] (Phone) [phone] (Fax)   Thanks, [employer]

## 2018-07-30 ENCOUNTER — Telehealth: Payer: Self-pay | Admitting: Physician Assistant

## 2018-07-30 ENCOUNTER — Encounter: Payer: Self-pay | Admitting: Physician Assistant

## 2018-07-30 ENCOUNTER — Other Ambulatory Visit: Payer: Self-pay | Admitting: Physician Assistant

## 2018-07-30 DIAGNOSIS — M5126 Other intervertebral disc displacement, lumbar region: Secondary | ICD-10-CM

## 2018-07-30 DIAGNOSIS — M47816 Spondylosis without myelopathy or radiculopathy, lumbar region: Secondary | ICD-10-CM

## 2018-07-30 DIAGNOSIS — G894 Chronic pain syndrome: Secondary | ICD-10-CM

## 2018-07-30 DIAGNOSIS — M5442 Lumbago with sciatica, left side: Secondary | ICD-10-CM

## 2018-07-30 DIAGNOSIS — G8929 Other chronic pain: Secondary | ICD-10-CM

## 2018-07-30 DIAGNOSIS — M5416 Radiculopathy, lumbar region: Secondary | ICD-10-CM

## 2018-07-30 MED ORDER — OXYCODONE HCL 5 MG PO CAPS: 5.0000 mg | ORAL_CAPSULE | ORAL | 0 refills | Status: DC | PRN

## 2018-07-30 NOTE — Telephone Encounter (Signed)
LVMTRC. Patient should have 9 refills left on medication.

## 2018-07-30 NOTE — Telephone Encounter (Signed)
Advised patient as below.  

## 2018-07-30 NOTE — Telephone Encounter (Signed)
Pt returned missed call.  Please call pt back. ° °Thanks, °TGH °

## 2018-07-30 NOTE — Telephone Encounter (Signed)
NCCSR reviewed. 

## 2018-08-02 NOTE — Telephone Encounter (Signed)
Please see chart messages

## 2018-08-16 ENCOUNTER — Ambulatory Visit: Payer: Self-pay | Admitting: Physician Assistant

## 2018-08-23 ENCOUNTER — Encounter: Payer: Self-pay | Admitting: Physician Assistant

## 2018-08-23 ENCOUNTER — Other Ambulatory Visit: Payer: Self-pay | Admitting: Physician Assistant

## 2018-08-23 DIAGNOSIS — M5416 Radiculopathy, lumbar region: Secondary | ICD-10-CM

## 2018-08-23 DIAGNOSIS — G894 Chronic pain syndrome: Secondary | ICD-10-CM

## 2018-08-23 DIAGNOSIS — G8929 Other chronic pain: Secondary | ICD-10-CM

## 2018-08-23 DIAGNOSIS — M47816 Spondylosis without myelopathy or radiculopathy, lumbar region: Secondary | ICD-10-CM

## 2018-08-23 DIAGNOSIS — M5442 Lumbago with sciatica, left side: Secondary | ICD-10-CM

## 2018-08-23 DIAGNOSIS — M5126 Other intervertebral disc displacement, lumbar region: Secondary | ICD-10-CM

## 2018-08-23 MED ORDER — OXYCODONE HCL 5 MG PO TABS: 5.0000 mg | ORAL_TABLET | ORAL | 0 refills | Status: DC | PRN

## 2018-08-23 MED ORDER — OXYCODONE HCL 5 MG PO CAPS: 5.0000 mg | ORAL_CAPSULE | ORAL | 0 refills | Status: DC | PRN

## 2018-08-23 NOTE — Telephone Encounter (Signed)
Pt needing her oxyCODONE (OXY IR/ROXICODONE) 5 MG immediate release tablet [facility]: [facility]  [address] [place], [place]  [phone]  [phone]  Pt gave the wrong pharmacy.  Thanks, [employer]

## 2018-09-15 ENCOUNTER — Other Ambulatory Visit: Payer: Self-pay | Admitting: Physician Assistant

## 2018-09-15 DIAGNOSIS — M5416 Radiculopathy, lumbar region: Secondary | ICD-10-CM

## 2018-09-15 DIAGNOSIS — M47816 Spondylosis without myelopathy or radiculopathy, lumbar region: Secondary | ICD-10-CM

## 2018-09-15 DIAGNOSIS — G894 Chronic pain syndrome: Secondary | ICD-10-CM

## 2018-09-15 MED ORDER — OXYCODONE HCL 5 MG PO TABS: 5.0000 mg | ORAL_TABLET | ORAL | 0 refills | Status: DC | PRN

## 2018-09-15 NOTE — Telephone Encounter (Signed)
NCCSR reviewed. Rx printed. 

## 2018-10-11 ENCOUNTER — Other Ambulatory Visit: Payer: Self-pay | Admitting: Physician Assistant

## 2018-10-11 DIAGNOSIS — M47816 Spondylosis without myelopathy or radiculopathy, lumbar region: Secondary | ICD-10-CM

## 2018-10-11 DIAGNOSIS — G894 Chronic pain syndrome: Secondary | ICD-10-CM

## 2018-10-11 DIAGNOSIS — M5416 Radiculopathy, lumbar region: Secondary | ICD-10-CM

## 2018-10-11 MED ORDER — OXYCODONE HCL 5 MG PO TABS: 5.0000 mg | ORAL_TABLET | ORAL | 0 refills | Status: DC | PRN

## 2018-10-21 DIAGNOSIS — F4310 Post-traumatic stress disorder, unspecified: Secondary | ICD-10-CM | POA: Diagnosis not present

## 2018-10-21 DIAGNOSIS — F900 Attention-deficit hyperactivity disorder, predominantly inattentive type: Secondary | ICD-10-CM | POA: Diagnosis not present

## 2018-11-01 ENCOUNTER — Other Ambulatory Visit: Payer: Self-pay | Admitting: Physician Assistant

## 2018-11-01 ENCOUNTER — Encounter: Payer: Self-pay | Admitting: Physician Assistant

## 2018-11-01 DIAGNOSIS — M5416 Radiculopathy, lumbar region: Secondary | ICD-10-CM

## 2018-11-01 DIAGNOSIS — M47816 Spondylosis without myelopathy or radiculopathy, lumbar region: Secondary | ICD-10-CM

## 2018-11-01 DIAGNOSIS — G894 Chronic pain syndrome: Secondary | ICD-10-CM

## 2018-11-01 MED ORDER — OXYCODONE HCL 5 MG PO TABS: 5.0000 mg | ORAL_TABLET | ORAL | 0 refills | Status: DC | PRN

## 2018-11-12 ENCOUNTER — Encounter: Payer: Self-pay | Admitting: Physician Assistant

## 2018-11-28 ENCOUNTER — Other Ambulatory Visit: Payer: Self-pay | Admitting: Physician Assistant

## 2018-11-28 DIAGNOSIS — M47816 Spondylosis without myelopathy or radiculopathy, lumbar region: Secondary | ICD-10-CM

## 2018-11-28 DIAGNOSIS — M5416 Radiculopathy, lumbar region: Secondary | ICD-10-CM

## 2018-11-28 DIAGNOSIS — G894 Chronic pain syndrome: Secondary | ICD-10-CM

## 2018-11-29 ENCOUNTER — Other Ambulatory Visit: Payer: Self-pay | Admitting: Physician Assistant

## 2018-11-29 ENCOUNTER — Telehealth: Payer: Self-pay

## 2018-11-29 DIAGNOSIS — M5416 Radiculopathy, lumbar region: Secondary | ICD-10-CM

## 2018-11-29 DIAGNOSIS — M47816 Spondylosis without myelopathy or radiculopathy, lumbar region: Secondary | ICD-10-CM

## 2018-11-29 DIAGNOSIS — G894 Chronic pain syndrome: Secondary | ICD-10-CM

## 2018-11-29 MED ORDER — OXYCODONE HCL 5 MG PO TABS: 5.0000 mg | ORAL_TABLET | ORAL | 0 refills | Status: DC | PRN

## 2018-11-29 NOTE — Telephone Encounter (Signed)
[facility] [place] 

## 2018-11-29 NOTE — Telephone Encounter (Signed)
Patient called stating that she requested medication refill for Oxycodone yesterday and needs it filled in the next 30 minutes. She lives in [place] and will be in [place] shortly. I told her that you received the request but states that she needs it filled as soon as possible.

## 2018-11-29 NOTE — Telephone Encounter (Signed)
Please review

## 2018-12-03 DIAGNOSIS — F900 Attention-deficit hyperactivity disorder, predominantly inattentive type: Secondary | ICD-10-CM | POA: Diagnosis not present

## 2018-12-03 DIAGNOSIS — F4310 Post-traumatic stress disorder, unspecified: Secondary | ICD-10-CM | POA: Diagnosis not present

## 2018-12-28 ENCOUNTER — Other Ambulatory Visit: Payer: Self-pay | Admitting: Physician Assistant

## 2018-12-28 DIAGNOSIS — G894 Chronic pain syndrome: Secondary | ICD-10-CM

## 2018-12-28 DIAGNOSIS — M5416 Radiculopathy, lumbar region: Secondary | ICD-10-CM

## 2018-12-28 DIAGNOSIS — M47816 Spondylosis without myelopathy or radiculopathy, lumbar region: Secondary | ICD-10-CM

## 2018-12-28 NOTE — Telephone Encounter (Signed)
Patient called requesting refills. [name] patient.

## 2018-12-29 ENCOUNTER — Encounter: Payer: Self-pay | Admitting: Physician Assistant

## 2018-12-29 ENCOUNTER — Other Ambulatory Visit: Payer: Self-pay

## 2018-12-29 DIAGNOSIS — G894 Chronic pain syndrome: Secondary | ICD-10-CM

## 2018-12-29 DIAGNOSIS — M5416 Radiculopathy, lumbar region: Secondary | ICD-10-CM

## 2018-12-29 DIAGNOSIS — M47816 Spondylosis without myelopathy or radiculopathy, lumbar region: Secondary | ICD-10-CM

## 2018-12-29 MED ORDER — OXYCODONE HCL 5 MG PO TABS: 5.0000 mg | ORAL_TABLET | ORAL | 0 refills | Status: DC | PRN

## 2018-12-29 NOTE — Telephone Encounter (Signed)
Pt is checking on this.  She states she is out of her medication and would like it to be sent in today if possible.   Thanks,   -Vernona Rieger

## 2018-12-29 NOTE — Telephone Encounter (Signed)
Patient had asked that the Rx go to the pharmacy in [place], it was sent to [place].  Can the [facility] Rx be cancelled and a new one sent to [facility]?

## 2018-12-29 NOTE — Telephone Encounter (Signed)
I sent this in

## 2018-12-29 NOTE — Telephone Encounter (Signed)
Will you review this medication request? Dr. Sherrie Mustache is not in the office this afternoon.

## 2018-12-29 NOTE — Telephone Encounter (Signed)
Please review

## 2018-12-29 NOTE — Telephone Encounter (Signed)
Sent new rx to [facility]. Please call and cancel Rx sent to [place].

## 2019-01-04 ENCOUNTER — Encounter: Payer: Self-pay | Admitting: Physician Assistant

## 2019-01-04 DIAGNOSIS — M47816 Spondylosis without myelopathy or radiculopathy, lumbar region: Secondary | ICD-10-CM

## 2019-01-04 DIAGNOSIS — M5416 Radiculopathy, lumbar region: Secondary | ICD-10-CM

## 2019-01-04 DIAGNOSIS — G894 Chronic pain syndrome: Secondary | ICD-10-CM

## 2019-01-04 NOTE — Telephone Encounter (Signed)
Pt called needing a refill on her   Oxycodone 5 mg 120 pills for one month  [facility]  [place] [facility] [place]  CB#  [phone]  Thanks teri

## 2019-01-05 MED ORDER — OXYCODONE HCL 5 MG PO TABS: 5.0000 mg | ORAL_TABLET | ORAL | 0 refills | Status: DC | PRN

## 2019-01-07 DIAGNOSIS — F4310 Post-traumatic stress disorder, unspecified: Secondary | ICD-10-CM | POA: Diagnosis not present

## 2019-01-07 DIAGNOSIS — F900 Attention-deficit hyperactivity disorder, predominantly inattentive type: Secondary | ICD-10-CM | POA: Diagnosis not present

## 2019-01-17 ENCOUNTER — Encounter: Payer: Self-pay | Admitting: Physician Assistant

## 2019-02-03 NOTE — Progress Notes (Signed)
Patient: Colleen Cook Female    DOB: 29-May-1991   28 y.o.   MRN: [id number] Visit Date: 02/04/2019  Today's Provider: Mar Daring, PA-C   Chief Complaint  Patient presents with  . Pain Management   Subjective:     HPI Patient presents today for a follow up on pain management. She is requesting refills on Oxycodone. She reports she is doing well currently. Has had some increased stress from moving her grandmother into a nursing home just before covid-19 and not being able to see her like she used to. Otherwise doing well. Has lost almost 28 pounds since last year doing keto. Has appt with her psychiatrist, Dr. Windle Cook, today.   No Known Allergies   Current Outpatient Medications:  .  clonazePAM (KLONOPIN) 0.5 MG tablet, Take 0.5 mg by mouth 2 (two) times daily as needed., Disp: , Rfl: 1 .  ibuprofen (ADVIL,MOTRIN) 200 MG tablet, Take 800 mg by mouth every 8 (eight) hours as needed., Disp: , Rfl:  .  ibuprofen (ADVIL,MOTRIN) 800 MG tablet, , Disp: , Rfl:  .  Melatonin 3 MG TABS, Take 2 tablets (6 mg total) by mouth at bedtime as needed., Disp: 60 tablet, Rfl: 0 .  Norgestimate-Ethinyl Estradiol Triphasic (ORTHO TRI-CYCLEN LO) 0.18/0.215/0.25 MG-25 MCG tab, Take 1 tablet by mouth daily., Disp: 1 Package, Rfl: 11 .  ondansetron (ZOFRAN ODT) 4 MG disintegrating tablet, Take 1 tablet (4 mg total) by mouth every 8 (eight) hours as needed for nausea or vomiting., Disp: 20 tablet, Rfl: 0 .  oxyCODONE (OXY IR/ROXICODONE) 5 MG immediate release tablet, Take 1 tablet (5 mg total) by mouth every 4 (four) hours as needed for severe pain., Disp: 120 tablet, Rfl: 0 .  sertraline (ZOLOFT) 50 MG tablet, Take 50 mg by mouth daily., Disp: , Rfl:   Review of Systems  Constitutional: Negative.   Respiratory: Negative.   Cardiovascular: Negative.   Musculoskeletal: Positive for back pain (chronic).  Neurological: Negative.   Psychiatric/Behavioral: Negative.     Social  History   Tobacco Use  . Smoking status: Never Smoker  . Smokeless tobacco: Never Used  Substance Use Topics  . Alcohol use: No      Objective:   BP (!) 130/92 (BP Location: Left Arm, Patient Position: Sitting, Cuff Size: Large)   Pulse (!) 120   Temp 98.6 F (37 C) (Oral)   Wt 205 lb 12.8 oz (93.4 kg)   BMI 31.29 kg/m  Vitals:   02/04/19 1051  BP: (!) 130/92  Pulse: (!) 120  Temp: 98.6 F (37 C)  TempSrc: Oral  Weight: 205 lb 12.8 oz (93.4 kg)     Physical Exam Vitals signs reviewed.  Constitutional:      General: She is not in acute distress.    Appearance: Normal appearance. She is well-developed. She is obese. She is not ill-appearing or diaphoretic.  Neck:     Musculoskeletal: Normal range of motion and neck supple.  Cardiovascular:     Rate and Rhythm: Normal rate and regular rhythm.     Pulses: Normal pulses.     Heart sounds: Normal heart sounds. No murmur. No friction rub. No gallop.   Pulmonary:     Effort: Pulmonary effort is normal. No respiratory distress.     Breath sounds: Normal breath sounds. No wheezing or rales.  Neurological:     Mental Status: She is alert.  Psychiatric:        Mood  and Affect: Mood normal.        Behavior: Behavior normal.        Thought Content: Thought content normal.        Judgment: Judgment normal.      No results found for any visits on 02/04/19.     Assessment & Plan    1. Chronic narcotic use Urine drug screen collected today for annual follow up. Continue oxycodone as prescribed. Has clonazepam from Dr. Doylene Canardonner. Patient aware to not take together. Has been stable on current regimen for a long time. I will see her back in the coming weeks for her CPE and recheck labs.  - Pain Mgt Scrn (14 Drugs), Ur  2. Spondylosis without myelopathy or radiculopathy, lumbar region See above medical treatment plan. - Pain Mgt Scrn (14 Drugs), Ur - oxyCODONE (OXY IR/ROXICODONE) 5 MG immediate release tablet; Take 1 tablet  (5 mg total) by mouth every 4 (four) hours as needed for severe pain.  Dispense: 120 tablet; Refill: 0  3. Lumbar radiculopathy See above medical treatment plan. - Pain Mgt Scrn (14 Drugs), Ur - oxyCODONE (OXY IR/ROXICODONE) 5 MG immediate release tablet; Take 1 tablet (5 mg total) by mouth every 4 (four) hours as needed for severe pain.  Dispense: 120 tablet; Refill: 0  4. Chronic pain syndrome See above medical treatment plan. - Pain Mgt Scrn (14 Drugs), Ur - oxyCODONE (OXY IR/ROXICODONE) 5 MG immediate release tablet; Take 1 tablet (5 mg total) by mouth every 4 (four) hours as needed for severe pain.  Dispense: 120 tablet; Refill: 0  5. Class 1 obesity due to excess calories with serious comorbidity and body mass index (BMI) of 31.0 to 31.9 in adult Doing well. Lost 28 pounds since last year doing keto.   6. Moderate episode of recurrent major depressive disorder (HCC) Stable. Followed by Dr. Doylene Canardonner.     Margaretann LovelessJennifer M Camy Leder, PA-C  [facility]

## 2019-02-04 ENCOUNTER — Encounter: Payer: Self-pay | Admitting: Physician Assistant

## 2019-02-04 ENCOUNTER — Ambulatory Visit (INDEPENDENT_AMBULATORY_CARE_PROVIDER_SITE_OTHER): Payer: BC Managed Care – PPO | Admitting: Physician Assistant

## 2019-02-04 ENCOUNTER — Other Ambulatory Visit: Payer: Self-pay

## 2019-02-04 VITALS — BP 130/92 | HR 120 | Temp 98.6°F | Wt 205.8 lb

## 2019-02-04 DIAGNOSIS — M47816 Spondylosis without myelopathy or radiculopathy, lumbar region: Secondary | ICD-10-CM

## 2019-02-04 DIAGNOSIS — F1190 Opioid use, unspecified, uncomplicated: Secondary | ICD-10-CM

## 2019-02-04 DIAGNOSIS — Z6831 Body mass index (BMI) 31.0-31.9, adult: Secondary | ICD-10-CM

## 2019-02-04 DIAGNOSIS — M5416 Radiculopathy, lumbar region: Secondary | ICD-10-CM

## 2019-02-04 DIAGNOSIS — E6609 Other obesity due to excess calories: Secondary | ICD-10-CM

## 2019-02-04 DIAGNOSIS — G894 Chronic pain syndrome: Secondary | ICD-10-CM | POA: Diagnosis not present

## 2019-02-04 DIAGNOSIS — F331 Major depressive disorder, recurrent, moderate: Secondary | ICD-10-CM

## 2019-02-04 MED ORDER — OXYCODONE HCL 5 MG PO TABS: 5.0000 mg | ORAL_TABLET | ORAL | 0 refills | Status: DC | PRN

## 2019-02-06 LAB — PAIN MGT SCRN (14 DRUGS), UR
Amphetamine Scrn, Ur: NEGATIVE ng/mL
BARBITURATE SCREEN URINE: NEGATIVE ng/mL
BENZODIAZEPINE SCREEN, URINE: POSITIVE ng/mL — AB
Buprenorphine, Urine: POSITIVE ng/mL — AB
CANNABINOIDS UR QL SCN: NEGATIVE ng/mL
Cocaine (Metab) Scrn, Ur: NEGATIVE ng/mL
Creatinine(Crt), U: 62 mg/dL (ref 20.0–300.0)
Fentanyl, Urine: POSITIVE pg/mL — AB
Meperidine Screen, Urine: NEGATIVE ng/mL
Methadone Screen, Urine: NEGATIVE ng/mL
OXYCODONE+OXYMORPHONE UR QL SCN: POSITIVE ng/mL — AB
Opiate Scrn, Ur: POSITIVE ng/mL — AB
Ph of Urine: 6.7 (ref 4.5–8.9)
Phencyclidine Qn, Ur: NEGATIVE ng/mL
Propoxyphene Scrn, Ur: NEGATIVE ng/mL
Tramadol Screen, Urine: NEGATIVE ng/mL

## 2019-02-09 ENCOUNTER — Other Ambulatory Visit: Payer: Self-pay | Admitting: Physician Assistant

## 2019-02-09 DIAGNOSIS — Z3009 Encounter for other general counseling and advice on contraception: Secondary | ICD-10-CM

## 2019-02-09 DIAGNOSIS — F900 Attention-deficit hyperactivity disorder, predominantly inattentive type: Secondary | ICD-10-CM | POA: Diagnosis not present

## 2019-02-09 DIAGNOSIS — F4310 Post-traumatic stress disorder, unspecified: Secondary | ICD-10-CM | POA: Diagnosis not present

## 2019-02-20 DIAGNOSIS — M25511 Pain in right shoulder: Secondary | ICD-10-CM | POA: Diagnosis not present

## 2019-02-20 DIAGNOSIS — M25512 Pain in left shoulder: Secondary | ICD-10-CM | POA: Diagnosis not present

## 2019-02-20 DIAGNOSIS — M542 Cervicalgia: Secondary | ICD-10-CM | POA: Diagnosis not present

## 2019-03-04 ENCOUNTER — Other Ambulatory Visit: Payer: Self-pay

## 2019-03-04 ENCOUNTER — Other Ambulatory Visit: Payer: Self-pay | Admitting: Physician Assistant

## 2019-03-04 DIAGNOSIS — M47816 Spondylosis without myelopathy or radiculopathy, lumbar region: Secondary | ICD-10-CM

## 2019-03-04 DIAGNOSIS — G894 Chronic pain syndrome: Secondary | ICD-10-CM

## 2019-03-04 DIAGNOSIS — Z20822 Contact with and (suspected) exposure to covid-19: Secondary | ICD-10-CM

## 2019-03-04 DIAGNOSIS — M5416 Radiculopathy, lumbar region: Secondary | ICD-10-CM

## 2019-03-04 DIAGNOSIS — R6889 Other general symptoms and signs: Secondary | ICD-10-CM | POA: Diagnosis not present

## 2019-03-04 MED ORDER — OXYCODONE HCL 5 MG PO TABS: 5.0000 mg | ORAL_TABLET | ORAL | 0 refills | Status: DC | PRN

## 2019-03-07 LAB — NOVEL CORONAVIRUS, NAA: SARS-CoV-2, NAA: NOT DETECTED

## 2019-03-18 DIAGNOSIS — F900 Attention-deficit hyperactivity disorder, predominantly inattentive type: Secondary | ICD-10-CM | POA: Diagnosis not present

## 2019-03-18 DIAGNOSIS — F4310 Post-traumatic stress disorder, unspecified: Secondary | ICD-10-CM | POA: Diagnosis not present

## 2019-04-01 ENCOUNTER — Other Ambulatory Visit: Payer: Self-pay | Admitting: Physician Assistant

## 2019-04-01 DIAGNOSIS — M5416 Radiculopathy, lumbar region: Secondary | ICD-10-CM

## 2019-04-01 DIAGNOSIS — G894 Chronic pain syndrome: Secondary | ICD-10-CM

## 2019-04-01 DIAGNOSIS — M47816 Spondylosis without myelopathy or radiculopathy, lumbar region: Secondary | ICD-10-CM

## 2019-04-01 MED ORDER — OXYCODONE HCL 5 MG PO TABS: 5.0000 mg | ORAL_TABLET | ORAL | 0 refills | Status: DC | PRN

## 2019-04-12 DIAGNOSIS — F900 Attention-deficit hyperactivity disorder, predominantly inattentive type: Secondary | ICD-10-CM | POA: Diagnosis not present

## 2019-04-12 DIAGNOSIS — F4310 Post-traumatic stress disorder, unspecified: Secondary | ICD-10-CM | POA: Diagnosis not present

## 2019-04-28 ENCOUNTER — Other Ambulatory Visit: Payer: Self-pay | Admitting: Physician Assistant

## 2019-04-28 DIAGNOSIS — M47816 Spondylosis without myelopathy or radiculopathy, lumbar region: Secondary | ICD-10-CM

## 2019-04-28 DIAGNOSIS — M5416 Radiculopathy, lumbar region: Secondary | ICD-10-CM

## 2019-04-28 DIAGNOSIS — G894 Chronic pain syndrome: Secondary | ICD-10-CM

## 2019-04-29 MED ORDER — OXYCODONE HCL 5 MG PO TABS: 5.0000 mg | ORAL_TABLET | ORAL | 0 refills | Status: DC | PRN

## 2019-05-24 ENCOUNTER — Encounter: Payer: Self-pay | Admitting: Physician Assistant

## 2019-05-24 DIAGNOSIS — Z3009 Encounter for other general counseling and advice on contraception: Secondary | ICD-10-CM

## 2019-05-24 DIAGNOSIS — M5416 Radiculopathy, lumbar region: Secondary | ICD-10-CM

## 2019-05-24 DIAGNOSIS — M47816 Spondylosis without myelopathy or radiculopathy, lumbar region: Secondary | ICD-10-CM

## 2019-05-24 DIAGNOSIS — G894 Chronic pain syndrome: Secondary | ICD-10-CM

## 2019-05-25 MED ORDER — OXYCODONE HCL 5 MG PO TABS: 5.0000 mg | ORAL_TABLET | ORAL | 0 refills | Status: DC | PRN

## 2019-05-25 MED ORDER — NORGESTIM-ETH ESTRAD TRIPHASIC 0.18/0.215/0.25 MG-25 MCG PO TABS: 1.0000 | ORAL_TABLET | Freq: Every day | ORAL | 3 refills | Status: DC

## 2019-05-27 ENCOUNTER — Telehealth: Payer: Self-pay | Admitting: Physician Assistant

## 2019-05-27 ENCOUNTER — Encounter: Payer: Self-pay | Admitting: Physician Assistant

## 2019-05-27 DIAGNOSIS — F4310 Post-traumatic stress disorder, unspecified: Secondary | ICD-10-CM | POA: Diagnosis not present

## 2019-05-27 DIAGNOSIS — F900 Attention-deficit hyperactivity disorder, predominantly inattentive type: Secondary | ICD-10-CM | POA: Diagnosis not present

## 2019-05-27 NOTE — Telephone Encounter (Signed)
Please Review

## 2019-05-27 NOTE — Telephone Encounter (Signed)
Pt is having an issue with getting her  oxyCODONE (OXY IR/ROXICODONE) 5 MG immediate release tablet refilled because she has switched to a new pharmacy and due to her taking the clonazepam.  Pt pays out of pocket through [employer].  The pharmacy is needing Anderson Malta to call them to let hem know if she approves this to be filled.  [id number] [place] [place] - [address], [place] [phone] (Phone) [phone] (Fax)   Pt is almost out and won't have any over the weekend.  Thanks, [employer]

## 2019-05-27 NOTE — Telephone Encounter (Signed)
Yes this is ok. Me and her psychiatrist have been in contact and are aware of both medications. We monitor her fills through [employer]

## 2019-05-27 NOTE — Telephone Encounter (Signed)
Spoke with the pharmacist and advised as directed below.

## 2019-06-20 DIAGNOSIS — Z20828 Contact with and (suspected) exposure to other viral communicable diseases: Secondary | ICD-10-CM | POA: Diagnosis not present

## 2019-06-20 DIAGNOSIS — R197 Diarrhea, unspecified: Secondary | ICD-10-CM | POA: Diagnosis not present

## 2019-06-20 DIAGNOSIS — Z7189 Other specified counseling: Secondary | ICD-10-CM | POA: Diagnosis not present

## 2019-06-20 DIAGNOSIS — R0989 Other specified symptoms and signs involving the circulatory and respiratory systems: Secondary | ICD-10-CM | POA: Diagnosis not present

## 2019-06-23 ENCOUNTER — Other Ambulatory Visit: Payer: Self-pay | Admitting: Physician Assistant

## 2019-06-23 DIAGNOSIS — M47816 Spondylosis without myelopathy or radiculopathy, lumbar region: Secondary | ICD-10-CM

## 2019-06-23 DIAGNOSIS — G894 Chronic pain syndrome: Secondary | ICD-10-CM

## 2019-06-23 DIAGNOSIS — M5416 Radiculopathy, lumbar region: Secondary | ICD-10-CM

## 2019-06-24 MED ORDER — OXYCODONE HCL 5 MG PO TABS: 5.0000 mg | ORAL_TABLET | ORAL | 0 refills | Status: DC | PRN

## 2019-07-06 DIAGNOSIS — F4310 Post-traumatic stress disorder, unspecified: Secondary | ICD-10-CM | POA: Diagnosis not present

## 2019-07-06 DIAGNOSIS — F900 Attention-deficit hyperactivity disorder, predominantly inattentive type: Secondary | ICD-10-CM | POA: Diagnosis not present

## 2019-07-18 ENCOUNTER — Encounter: Payer: Self-pay | Admitting: Physician Assistant

## 2019-07-21 ENCOUNTER — Other Ambulatory Visit: Payer: Self-pay | Admitting: Physician Assistant

## 2019-07-21 DIAGNOSIS — M47816 Spondylosis without myelopathy or radiculopathy, lumbar region: Secondary | ICD-10-CM

## 2019-07-21 DIAGNOSIS — M5416 Radiculopathy, lumbar region: Secondary | ICD-10-CM

## 2019-07-21 DIAGNOSIS — G894 Chronic pain syndrome: Secondary | ICD-10-CM

## 2019-07-22 MED ORDER — OXYCODONE HCL 5 MG PO TABS: 5.0000 mg | ORAL_TABLET | ORAL | 0 refills | Status: DC | PRN

## 2019-07-29 DIAGNOSIS — F4310 Post-traumatic stress disorder, unspecified: Secondary | ICD-10-CM | POA: Diagnosis not present

## 2019-07-29 DIAGNOSIS — F900 Attention-deficit hyperactivity disorder, predominantly inattentive type: Secondary | ICD-10-CM | POA: Diagnosis not present

## 2019-08-19 ENCOUNTER — Ambulatory Visit: Payer: BC Managed Care – PPO | Admitting: Physician Assistant

## 2019-08-19 ENCOUNTER — Other Ambulatory Visit: Payer: Self-pay | Admitting: Physician Assistant

## 2019-08-19 DIAGNOSIS — G894 Chronic pain syndrome: Secondary | ICD-10-CM

## 2019-08-19 DIAGNOSIS — M5416 Radiculopathy, lumbar region: Secondary | ICD-10-CM

## 2019-08-19 DIAGNOSIS — M47816 Spondylosis without myelopathy or radiculopathy, lumbar region: Secondary | ICD-10-CM

## 2019-08-19 MED ORDER — OXYCODONE HCL 5 MG PO TABS: 5.0000 mg | ORAL_TABLET | ORAL | 0 refills | Status: DC | PRN

## 2019-09-15 ENCOUNTER — Encounter: Payer: Self-pay | Admitting: Physician Assistant

## 2019-09-15 ENCOUNTER — Ambulatory Visit: Payer: BC Managed Care – PPO | Admitting: Physician Assistant

## 2019-09-15 ENCOUNTER — Other Ambulatory Visit: Payer: Self-pay | Admitting: Physician Assistant

## 2019-09-15 DIAGNOSIS — G894 Chronic pain syndrome: Secondary | ICD-10-CM

## 2019-09-15 DIAGNOSIS — M47816 Spondylosis without myelopathy or radiculopathy, lumbar region: Secondary | ICD-10-CM

## 2019-09-15 DIAGNOSIS — M5416 Radiculopathy, lumbar region: Secondary | ICD-10-CM

## 2019-09-15 MED ORDER — OXYCODONE HCL 5 MG PO TABS: 5.0000 mg | ORAL_TABLET | ORAL | 0 refills | Status: DC | PRN

## 2019-10-11 ENCOUNTER — Other Ambulatory Visit: Payer: Self-pay | Admitting: Physician Assistant

## 2019-10-11 DIAGNOSIS — G894 Chronic pain syndrome: Secondary | ICD-10-CM

## 2019-10-11 DIAGNOSIS — M47816 Spondylosis without myelopathy or radiculopathy, lumbar region: Secondary | ICD-10-CM

## 2019-10-11 DIAGNOSIS — M5416 Radiculopathy, lumbar region: Secondary | ICD-10-CM

## 2019-10-11 MED ORDER — OXYCODONE HCL 5 MG PO TABS: 5.0000 mg | ORAL_TABLET | ORAL | 0 refills | Status: DC | PRN

## 2019-11-07 ENCOUNTER — Other Ambulatory Visit: Payer: Self-pay | Admitting: Physician Assistant

## 2019-11-07 DIAGNOSIS — G894 Chronic pain syndrome: Secondary | ICD-10-CM

## 2019-11-07 DIAGNOSIS — M47816 Spondylosis without myelopathy or radiculopathy, lumbar region: Secondary | ICD-10-CM

## 2019-11-07 DIAGNOSIS — M5416 Radiculopathy, lumbar region: Secondary | ICD-10-CM

## 2019-11-07 MED ORDER — OXYCODONE HCL 5 MG PO TABS: 5.0000 mg | ORAL_TABLET | ORAL | 0 refills | Status: DC | PRN

## 2019-12-05 ENCOUNTER — Other Ambulatory Visit: Payer: Self-pay | Admitting: Physician Assistant

## 2019-12-05 DIAGNOSIS — M5416 Radiculopathy, lumbar region: Secondary | ICD-10-CM

## 2019-12-05 DIAGNOSIS — M47816 Spondylosis without myelopathy or radiculopathy, lumbar region: Secondary | ICD-10-CM

## 2019-12-05 DIAGNOSIS — G894 Chronic pain syndrome: Secondary | ICD-10-CM

## 2019-12-06 MED ORDER — OXYCODONE HCL 5 MG PO TABS: 5.0000 mg | ORAL_TABLET | ORAL | 0 refills | Status: DC | PRN

## 2019-12-29 ENCOUNTER — Other Ambulatory Visit: Payer: Self-pay | Admitting: Physician Assistant

## 2019-12-29 DIAGNOSIS — G894 Chronic pain syndrome: Secondary | ICD-10-CM

## 2019-12-29 DIAGNOSIS — M47816 Spondylosis without myelopathy or radiculopathy, lumbar region: Secondary | ICD-10-CM

## 2019-12-29 DIAGNOSIS — M5416 Radiculopathy, lumbar region: Secondary | ICD-10-CM

## 2019-12-30 MED ORDER — OXYCODONE HCL 5 MG PO TABS: 5.0000 mg | ORAL_TABLET | ORAL | 0 refills | Status: DC | PRN

## 2020-02-01 ENCOUNTER — Other Ambulatory Visit: Payer: Self-pay | Admitting: Family Medicine

## 2020-02-01 ENCOUNTER — Encounter: Payer: Self-pay | Admitting: Physician Assistant

## 2020-02-01 DIAGNOSIS — M5416 Radiculopathy, lumbar region: Secondary | ICD-10-CM

## 2020-02-01 DIAGNOSIS — M47816 Spondylosis without myelopathy or radiculopathy, lumbar region: Secondary | ICD-10-CM

## 2020-02-01 DIAGNOSIS — G894 Chronic pain syndrome: Secondary | ICD-10-CM

## 2020-02-02 MED ORDER — OXYCODONE HCL 5 MG PO TABS: 5.0000 mg | ORAL_TABLET | ORAL | 0 refills | Status: DC | PRN

## 2020-03-02 ENCOUNTER — Other Ambulatory Visit: Payer: Self-pay | Admitting: Family Medicine

## 2020-03-02 DIAGNOSIS — M47816 Spondylosis without myelopathy or radiculopathy, lumbar region: Secondary | ICD-10-CM

## 2020-03-02 DIAGNOSIS — G894 Chronic pain syndrome: Secondary | ICD-10-CM

## 2020-03-02 DIAGNOSIS — M5416 Radiculopathy, lumbar region: Secondary | ICD-10-CM

## 2020-03-02 MED ORDER — OXYCODONE HCL 5 MG PO TABS: 5.0000 mg | ORAL_TABLET | ORAL | 0 refills | Status: DC | PRN

## 2020-03-29 ENCOUNTER — Other Ambulatory Visit: Payer: Self-pay | Admitting: Physician Assistant

## 2020-03-29 DIAGNOSIS — M47816 Spondylosis without myelopathy or radiculopathy, lumbar region: Secondary | ICD-10-CM

## 2020-03-29 DIAGNOSIS — M5416 Radiculopathy, lumbar region: Secondary | ICD-10-CM

## 2020-03-29 DIAGNOSIS — G894 Chronic pain syndrome: Secondary | ICD-10-CM

## 2020-03-30 MED ORDER — OXYCODONE HCL 5 MG PO TABS: 5.0000 mg | ORAL_TABLET | ORAL | 0 refills | Status: DC | PRN

## 2020-03-30 NOTE — Telephone Encounter (Signed)
Needs f/u before next refill

## 2020-04-27 ENCOUNTER — Other Ambulatory Visit: Payer: Self-pay | Admitting: Physician Assistant

## 2020-04-27 DIAGNOSIS — G894 Chronic pain syndrome: Secondary | ICD-10-CM

## 2020-04-27 DIAGNOSIS — M47816 Spondylosis without myelopathy or radiculopathy, lumbar region: Secondary | ICD-10-CM

## 2020-04-27 DIAGNOSIS — M5416 Radiculopathy, lumbar region: Secondary | ICD-10-CM

## 2020-04-27 MED ORDER — OXYCODONE HCL 5 MG PO TABS: 5.0000 mg | ORAL_TABLET | ORAL | 0 refills | Status: DC | PRN

## 2020-05-25 ENCOUNTER — Other Ambulatory Visit: Payer: Self-pay | Admitting: Physician Assistant

## 2020-05-25 DIAGNOSIS — G894 Chronic pain syndrome: Secondary | ICD-10-CM

## 2020-05-25 DIAGNOSIS — M5416 Radiculopathy, lumbar region: Secondary | ICD-10-CM

## 2020-05-25 DIAGNOSIS — M47816 Spondylosis without myelopathy or radiculopathy, lumbar region: Secondary | ICD-10-CM

## 2020-05-25 MED ORDER — OXYCODONE HCL 5 MG PO TABS: 5.0000 mg | ORAL_TABLET | ORAL | 0 refills | Status: DC | PRN

## 2020-05-26 ENCOUNTER — Encounter: Payer: Self-pay | Admitting: Physician Assistant

## 2020-05-26 DIAGNOSIS — M5416 Radiculopathy, lumbar region: Secondary | ICD-10-CM

## 2020-05-26 DIAGNOSIS — G894 Chronic pain syndrome: Secondary | ICD-10-CM

## 2020-05-26 DIAGNOSIS — M47816 Spondylosis without myelopathy or radiculopathy, lumbar region: Secondary | ICD-10-CM

## 2020-05-28 MED ORDER — OXYCODONE HCL 5 MG PO TABS: 5.0000 mg | ORAL_TABLET | ORAL | 0 refills | Status: DC | PRN

## 2020-06-28 ENCOUNTER — Other Ambulatory Visit: Payer: Self-pay | Admitting: Physician Assistant

## 2020-06-28 DIAGNOSIS — M47816 Spondylosis without myelopathy or radiculopathy, lumbar region: Secondary | ICD-10-CM

## 2020-06-28 DIAGNOSIS — M5416 Radiculopathy, lumbar region: Secondary | ICD-10-CM

## 2020-06-28 DIAGNOSIS — G894 Chronic pain syndrome: Secondary | ICD-10-CM

## 2020-06-29 ENCOUNTER — Ambulatory Visit: Payer: Self-pay | Admitting: Physician Assistant

## 2020-06-29 MED ORDER — OXYCODONE HCL 5 MG PO TABS: 5.0000 mg | ORAL_TABLET | ORAL | 0 refills | Status: DC | PRN

## 2020-07-04 ENCOUNTER — Ambulatory Visit (INDEPENDENT_AMBULATORY_CARE_PROVIDER_SITE_OTHER): Payer: Self-pay | Admitting: Physician Assistant

## 2020-07-04 ENCOUNTER — Other Ambulatory Visit: Payer: Self-pay

## 2020-07-04 ENCOUNTER — Encounter: Payer: Self-pay | Admitting: Physician Assistant

## 2020-07-04 VITALS — BP 112/85 | HR 106 | Resp 16 | Wt 169.5 lb

## 2020-07-04 DIAGNOSIS — Z79899 Other long term (current) drug therapy: Secondary | ICD-10-CM

## 2020-07-04 DIAGNOSIS — F331 Major depressive disorder, recurrent, moderate: Secondary | ICD-10-CM

## 2020-07-04 DIAGNOSIS — Z6825 Body mass index (BMI) 25.0-25.9, adult: Secondary | ICD-10-CM

## 2020-07-04 DIAGNOSIS — F1190 Opioid use, unspecified, uncomplicated: Secondary | ICD-10-CM

## 2020-07-04 DIAGNOSIS — G894 Chronic pain syndrome: Secondary | ICD-10-CM

## 2020-07-04 NOTE — Progress Notes (Signed)
Established patient visit   Patient: Colleen Cook   DOB: [birth year]   29 y.o. Female  MRN: [id number] Visit Date: 07/04/2020  Today's healthcare provider: [name], PA-C   No chief complaint on file.  Subjective    HPI  Patient here to follow up on her medication refills. She reports that she feels stable one her medications for oxycodone, Sertraline, clonazepam.  Patient Active Problem List   Diagnosis Date Noted  . Chronic narcotic use 02/04/2019  . Personality disorder (HCC) 11/04/2017  . Lumbar radiculopathy 04/14/2017  . Spondylosis without myelopathy or radiculopathy, lumbar region 04/14/2017  . Attention deficit hyperactivity disorder (ADHD) 04/14/2017  . Moderate episode of recurrent major depressive disorder (HCC) 04/14/2017  . Generalized anxiety disorder 04/14/2017  . Chronic pain syndrome 04/14/2017  . PTSD (post-traumatic stress disorder) 04/14/2017  . Tachycardia 05/27/2013   Past Medical History:  Diagnosis Date  . Lumbar radiculopathy        Medications: Outpatient Medications Prior to Visit  Medication Sig  . clonazePAM (KLONOPIN) 0.5 MG tablet Take 0.5 mg by mouth 2 (two) times daily as needed.  Marland Kitchen ibuprofen (ADVIL,MOTRIN) 200 MG tablet Take 800 mg by mouth every 8 (eight) hours as needed.  Marland Kitchen oxyCODONE (OXY IR/ROXICODONE) 5 MG immediate release tablet Take 1 tablet (5 mg total) by mouth every 4 (four) hours as needed for severe pain.  Marland Kitchen sertraline (ZOLOFT) 50 MG tablet Take 50 mg by mouth daily.  Marland Kitchen ibuprofen (ADVIL,MOTRIN) 800 MG tablet   . Melatonin 3 MG TABS Take 2 tablets (6 mg total) by mouth at bedtime as needed.  . Norgestimate-Ethinyl Estradiol Triphasic (TRI-LO-MARZIA) 0.18/0.215/0.25 MG-25 MCG tab Take 1 tablet by mouth daily.  . ondansetron (ZOFRAN ODT) 4 MG disintegrating tablet Take 1 tablet (4 mg total) by mouth every 8 (eight) hours as needed for nausea or vomiting.   No facility-administered medications  prior to visit.    Review of Systems  Constitutional: Negative.   Respiratory: Negative.   Cardiovascular: Negative.   Neurological: Negative.   Psychiatric/Behavioral: Negative.     Last CBC Lab Results  Component Value Date   WBC 8.5 01/05/2018   HGB 13.2 01/05/2018   HCT 39.3 01/05/2018   MCV 90.4 01/05/2018   MCH 30.3 01/05/2018   RDW 13.1 01/05/2018   PLT 359 01/05/2018   Last metabolic panel Lab Results  Component Value Date   GLUCOSE 94 01/05/2018   NA 138 01/05/2018   K 4.3 01/05/2018   CL 103 01/05/2018   CO2 25 01/05/2018   BUN 16 01/05/2018   CREATININE 0.90 01/05/2018   GFRNONAA >60 01/05/2018   GFRAA >60 01/05/2018   CALCIUM 9.2 01/05/2018   PROT 7.0 11/04/2017   ALBUMIN 4.3 11/04/2017   LABGLOB 2.7 11/04/2017   AGRATIO 1.6 11/04/2017   BILITOT 0.4 11/04/2017   ALKPHOS 59 11/04/2017   AST 16 11/04/2017   ALT 15 11/04/2017   ANIONGAP 10 01/05/2018      Objective    BP 112/85 (BP Location: Left Arm, Patient Position: Sitting, Cuff Size: Large)   Pulse (!) 106   Resp 16   Wt 169 lb 8 oz (76.9 kg)   BMI 25.77 kg/m  BP Readings from Last 3 Encounters:  07/04/20 112/85  02/04/19 (!) 130/92  05/13/18 130/87   Wt Readings from Last 3 Encounters:  07/04/20 169 lb 8 oz (76.9 kg)  02/04/19 205 lb 12.8 oz (93.4 kg)  05/13/18 228 lb 9.6  oz (103.7 kg)      Physical Exam Vitals reviewed.  Constitutional:      Appearance: Normal appearance. She is well-developed, well-groomed and overweight.  HENT:     Head: Normocephalic and atraumatic.  Pulmonary:     Effort: Pulmonary effort is normal. No respiratory distress.  Musculoskeletal:     Cervical back: Normal range of motion and neck supple.  Neurological:     Mental Status: She is alert.  Psychiatric:        Mood and Affect: Mood normal.        Behavior: Behavior normal. Behavior is cooperative.        Thought Content: Thought content normal.        Judgment: Judgment normal.      No  results found for any visits on 07/04/20.  Assessment & Plan     1. Chronic pain syndrome Stable. Continue Oxycodone 5mg . Urine drug screen collected today.  2. Chronic narcotic use See above medical treatment plan. - Pain Mgt Scrn (14 Drugs), Ur  3. Chronic prescription benzodiazepine use Stable. These are prescribed by her psychiatrist, Dr. . We are both aware of each of Fredricka Bonine prescribing and monitor her NCCSR. She has never asked for early refills or lost medications.  - Pain Mgt Scrn (14 Drugs), Ur  4. BMI 25.0-25.9,adult Counseled patient on healthy lifestyle modifications including dieting and exercise.  Doing very well. Has lost 35 pounds. Doing lower calorie with intermittent fasting and exercise.   5. Moderate episode of recurrent major depressive disorder (HCC) Stable on Sertraline. This is prescribed by Dr. 03-29-2006.    No follow-ups on file.      Fredricka Bonine, PA-C, have reviewed all documentation for this visit. The documentation on 07/04/20 for the exam, diagnosis, procedures, and orders are all accurate and complete.   07/06/20  [facility] [phone] (phone) [phone] (fax)  [facility]

## 2020-07-11 LAB — PAIN MGT SCRN (14 DRUGS), UR
Amphetamine Scrn, Ur: NEGATIVE ng/mL
BARBITURATE SCREEN URINE: NEGATIVE ng/mL
BENZODIAZEPINE SCREEN, URINE: NEGATIVE ng/mL
Buprenorphine, Urine: POSITIVE ng/mL — AB
CANNABINOIDS UR QL SCN: NEGATIVE ng/mL
Cocaine (Metab) Scrn, Ur: NEGATIVE ng/mL
Creatinine(Crt), U: 170.1 mg/dL (ref 20.0–300.0)
Fentanyl, Urine: NEGATIVE pg/mL
Meperidine Screen, Urine: NEGATIVE ng/mL
Methadone Screen, Urine: NEGATIVE ng/mL
OXYCODONE+OXYMORPHONE UR QL SCN: POSITIVE ng/mL — AB
Opiate Scrn, Ur: NEGATIVE ng/mL
Ph of Urine: 7.8 (ref 4.5–8.9)
Phencyclidine Qn, Ur: NEGATIVE ng/mL
Propoxyphene Scrn, Ur: NEGATIVE ng/mL
Tramadol Screen, Urine: NEGATIVE ng/mL

## 2020-07-25 ENCOUNTER — Other Ambulatory Visit: Payer: Self-pay | Admitting: Physician Assistant

## 2020-07-25 DIAGNOSIS — M5416 Radiculopathy, lumbar region: Secondary | ICD-10-CM

## 2020-07-25 DIAGNOSIS — G894 Chronic pain syndrome: Secondary | ICD-10-CM

## 2020-07-25 DIAGNOSIS — M47816 Spondylosis without myelopathy or radiculopathy, lumbar region: Secondary | ICD-10-CM

## 2020-07-25 MED ORDER — OXYCODONE HCL 5 MG PO TABS: 5.0000 mg | ORAL_TABLET | ORAL | 0 refills | Status: DC | PRN

## 2020-08-27 ENCOUNTER — Other Ambulatory Visit: Payer: Self-pay | Admitting: Physician Assistant

## 2020-08-27 DIAGNOSIS — M47816 Spondylosis without myelopathy or radiculopathy, lumbar region: Secondary | ICD-10-CM

## 2020-08-27 DIAGNOSIS — M5416 Radiculopathy, lumbar region: Secondary | ICD-10-CM

## 2020-08-27 DIAGNOSIS — G894 Chronic pain syndrome: Secondary | ICD-10-CM

## 2020-08-27 MED ORDER — OXYCODONE HCL 5 MG PO TABS: 5.0000 mg | ORAL_TABLET | ORAL | 0 refills | Status: DC | PRN

## 2020-09-24 ENCOUNTER — Other Ambulatory Visit: Payer: Self-pay | Admitting: Physician Assistant

## 2020-09-24 DIAGNOSIS — G894 Chronic pain syndrome: Secondary | ICD-10-CM

## 2020-09-24 DIAGNOSIS — M47816 Spondylosis without myelopathy or radiculopathy, lumbar region: Secondary | ICD-10-CM

## 2020-09-24 DIAGNOSIS — M5416 Radiculopathy, lumbar region: Secondary | ICD-10-CM

## 2020-09-24 MED ORDER — OXYCODONE HCL 5 MG PO TABS: 5.0000 mg | ORAL_TABLET | ORAL | 0 refills | Status: DC | PRN

## 2020-10-09 ENCOUNTER — Encounter: Payer: Self-pay | Admitting: Physician Assistant

## 2020-10-21 ENCOUNTER — Other Ambulatory Visit: Payer: Self-pay | Admitting: Physician Assistant

## 2020-10-21 DIAGNOSIS — M47816 Spondylosis without myelopathy or radiculopathy, lumbar region: Secondary | ICD-10-CM

## 2020-10-21 DIAGNOSIS — G894 Chronic pain syndrome: Secondary | ICD-10-CM

## 2020-10-21 DIAGNOSIS — M5416 Radiculopathy, lumbar region: Secondary | ICD-10-CM

## 2020-10-22 MED ORDER — OXYCODONE HCL 5 MG PO TABS: 5.0000 mg | ORAL_TABLET | ORAL | 0 refills | Status: DC | PRN

## 2020-11-08 ENCOUNTER — Telehealth: Payer: Self-pay

## 2020-11-08 NOTE — Telephone Encounter (Signed)
We could put her in on Monday 11/12/20 at 11am in the HFU slot

## 2020-11-08 NOTE — Telephone Encounter (Signed)
Copied from CRM [id number]. Topic: Appointment Scheduling - Scheduling Inquiry for Clinic >> Nov 08, 2020  9:47 AM Leafy Ro wrote: Reason for CRM: Pt is calling and would like to know if jenni can work her in for a cpe before she leaves. Pt does not want to see dr b

## 2020-11-08 NOTE — Telephone Encounter (Signed)
Apt scheduled for 11/12/2020 at 11am.  Pt advised.   Thanks,   -Vernona Rieger

## 2020-11-12 ENCOUNTER — Encounter: Payer: Self-pay | Admitting: Physician Assistant

## 2020-11-12 ENCOUNTER — Ambulatory Visit: Payer: Self-pay | Admitting: Physician Assistant

## 2020-11-12 ENCOUNTER — Telehealth (INDEPENDENT_AMBULATORY_CARE_PROVIDER_SITE_OTHER): Payer: Self-pay | Admitting: Physician Assistant

## 2020-11-12 DIAGNOSIS — F1190 Opioid use, unspecified, uncomplicated: Secondary | ICD-10-CM

## 2020-11-12 DIAGNOSIS — M47816 Spondylosis without myelopathy or radiculopathy, lumbar region: Secondary | ICD-10-CM

## 2020-11-12 DIAGNOSIS — G894 Chronic pain syndrome: Secondary | ICD-10-CM

## 2020-11-12 DIAGNOSIS — M5416 Radiculopathy, lumbar region: Secondary | ICD-10-CM

## 2020-11-12 MED ORDER — OXYCODONE HCL 5 MG PO TABS
5.0000 mg | ORAL_TABLET | ORAL | 0 refills | Status: DC | PRN
Start: 2020-11-19 — End: 2020-12-13

## 2020-11-12 NOTE — Progress Notes (Signed)
MyChart Video Visit    Virtual Visit via Video Note   This visit type was conducted due to national recommendations for restrictions regarding the COVID-19 Pandemic (e.g. social distancing) in an effort to limit this patient's exposure and mitigate transmission in our community. This patient is at least at moderate risk for complications without adequate follow up. This format is felt to be most appropriate for this patient at this time. Physical exam was limited by quality of the video and audio technology used for the visit.   Interactive audio and video communications were attempted, although failed due to patient's inability to connect to video. Continued visit with audio only interaction with patient agreement.  Patient location: Home Provider location: [facility]  I discussed the limitations of evaluation and management by telemedicine and the availability of in person appointments. The patient expressed understanding and agreed to proceed.  Patient: Colleen Cook   DOB: 1991/08/08   30 y.o. Female  MRN: [id number] Visit Date: 11/12/2020  Today's healthcare provider: [name], PA-C   No chief complaint on file.  Subjective    HPI  Patient presents today for a virtual visit today to follow up chronic back pain and to say farewell to provider.   She is doing well with her current medication regiment. She also is followed by Psychiatry for her other medications. 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   Patient Active Problem List   Diagnosis Date Noted  . Chronic narcotic use 02/04/2019  . Personality disorder (HCC) 11/04/2017  . Lumbar radiculopathy 04/14/2017  . Spondylosis without myelopathy or radiculopathy, lumbar region 04/14/2017  . Attention deficit hyperactivity disorder (ADHD) 04/14/2017  . Moderate episode of recurrent major depressive disorder (HCC) 04/14/2017  .  Generalized anxiety disorder 04/14/2017  . Chronic pain syndrome 04/14/2017  . PTSD (post-traumatic stress disorder) 04/14/2017  . Tachycardia 05/27/2013   Past Medical History:  Diagnosis Date  . Lumbar radiculopathy       Medications: Outpatient Medications Prior to Visit  Medication Sig  . clonazePAM (KLONOPIN) 0.5 MG tablet Take 0.5 mg by mouth 2 (two) times daily as needed.  Marland Kitchen ibuprofen (ADVIL,MOTRIN) 200 MG tablet Take 800 mg by mouth every 8 (eight) hours as needed.  Marland Kitchen oxyCODONE (OXY IR/ROXICODONE) 5 MG immediate release tablet Take 1 tablet (5 mg total) by mouth every 4 (four) hours as needed for severe pain.  Marland Kitchen sertraline (ZOLOFT) 50 MG tablet Take 50 mg by mouth daily.   No facility-administered medications prior to visit.    Review of Systems  Constitutional: Negative.   Respiratory: Negative.   Cardiovascular: Negative.   Neurological: Negative.     Last CBC Lab Results  Component Value Date   WBC 8.5 01/05/2018   HGB 13.2 01/05/2018   HCT 39.3 01/05/2018   MCV 90.4 01/05/2018   MCH 30.3 01/05/2018   RDW 13.1 01/05/2018   PLT 359 01/05/2018   Last metabolic panel Lab Results  Component Value Date   GLUCOSE 94 01/05/2018   NA 138 01/05/2018   K 4.3 01/05/2018   CL 103 01/05/2018   CO2 25 01/05/2018   BUN 16 01/05/2018   CREATININE 0.90 01/05/2018   GFRNONAA >60 01/05/2018   GFRAA >60 01/05/2018   CALCIUM 9.2 01/05/2018   PROT 7.0 11/04/2017   ALBUMIN 4.3 11/04/2017   LABGLOB 2.7 11/04/2017   AGRATIO 1.6 11/04/2017   BILITOT  0.4 11/04/2017   ALKPHOS 59 11/04/2017   AST 16 11/04/2017   ALT 15 11/04/2017   ANIONGAP 10 01/05/2018      Objective    There were no vitals taken for this visit. BP Readings from Last 3 Encounters:  07/04/20 112/85  02/04/19 (!) 130/92  05/13/18 130/87   Wt Readings from Last 3 Encounters:  07/04/20 169 lb 8 oz (76.9 kg)  02/04/19 205 lb 12.8 oz (93.4 kg)  05/13/18 228 lb 9.6 oz (103.7 kg)      Physical  Exam     Assessment & Plan     1. Chronic pain syndrome Stable. Diagnosis pulled for medication refill. Continue current medical treatment plan. - oxyCODONE (OXY IR/ROXICODONE) 5 MG immediate release tablet; Take 1 tablet (5 mg total) by mouth every 4 (four) hours as needed for severe pain.  Dispense: 120 tablet; Refill: 0  2. Lumbar radiculopathy Stable. Diagnosis pulled for medication refill. Continue current medical treatment plan. She is going to establish with Dr. Sherrie Mustache in June 2022.  - oxyCODONE (OXY IR/ROXICODONE) 5 MG immediate release tablet; Take 1 tablet (5 mg total) by mouth every 4 (four) hours as needed for severe pain.  Dispense: 120 tablet; Refill: 0  3. Spondylosis without myelopathy or radiculopathy, lumbar region Stable. Diagnosis pulled for medication refill. Continue current medical treatment plan. - oxyCODONE (OXY IR/ROXICODONE) 5 MG immediate release tablet; Take 1 tablet (5 mg total) by mouth every 4 (four) hours as needed for severe pain.  Dispense: 120 tablet; Refill: 0  4. Chronic narcotic use See above medical treatment plan.   No follow-ups on file.     I discussed the assessment and treatment plan with the patient. The patient was provided an opportunity to ask questions and all were answered. The patient agreed with the plan and demonstrated an understanding of the instructions.   The patient was advised to call back or seek an in-person evaluation if the symptoms worsen or if the condition fails to improve as anticipated.  I provided 8 minutes of non-face-to-face time during this encounter.  Delmer Islam, PA-C, have reviewed all documentation for this visit. The documentation on 11/12/20 for the exam, diagnosis, procedures, and orders are all accurate and complete.  Reine Just [facility] [phone] (phone) [phone] (fax)  [facility]

## 2020-12-13 ENCOUNTER — Other Ambulatory Visit: Payer: Self-pay | Admitting: Physician Assistant

## 2020-12-13 DIAGNOSIS — M47816 Spondylosis without myelopathy or radiculopathy, lumbar region: Secondary | ICD-10-CM

## 2020-12-13 DIAGNOSIS — M5416 Radiculopathy, lumbar region: Secondary | ICD-10-CM

## 2020-12-13 DIAGNOSIS — G894 Chronic pain syndrome: Secondary | ICD-10-CM

## 2020-12-14 MED ORDER — OXYCODONE HCL 5 MG PO TABS: 5.0000 mg | ORAL_TABLET | ORAL | 0 refills | Status: DC | PRN

## 2021-01-15 ENCOUNTER — Other Ambulatory Visit: Payer: Self-pay | Admitting: Family Medicine

## 2021-01-15 ENCOUNTER — Ambulatory Visit: Payer: Self-pay | Admitting: Family Medicine

## 2021-01-15 DIAGNOSIS — M5416 Radiculopathy, lumbar region: Secondary | ICD-10-CM

## 2021-01-15 DIAGNOSIS — G894 Chronic pain syndrome: Secondary | ICD-10-CM

## 2021-01-15 DIAGNOSIS — M47816 Spondylosis without myelopathy or radiculopathy, lumbar region: Secondary | ICD-10-CM

## 2021-01-15 MED ORDER — OXYCODONE HCL 5 MG PO TABS: 5.0000 mg | ORAL_TABLET | ORAL | 0 refills | Status: DC | PRN

## 2021-01-15 NOTE — Progress Notes (Deleted)
      Established patient visit   Patient: Colleen Cook   DOB: 05-04-1991   30 y.o. Female  MRN: [id number] Visit Date: 01/15/2021  Today's healthcare provider: Mila Merry, MD   No chief complaint on file.  Subjective    HPI  Follow up for chronic pain:  The patient was last seen for this 11/12/2020 (seen by Joycelyn Man, PA-C via virtual visit)  Changes made at last visit include none.  She reports {excellent/good/fair/poor:19665} compliance with treatment. She feels that condition is {improved/worse/unchanged:3041574}. She {is/is not:21021397} having side effects. ***  -----------------------------------------------------------------------------------------   Patient Active Problem List   Diagnosis Date Noted  . Chronic narcotic use 02/04/2019  . Personality disorder (HCC) 11/04/2017  . Lumbar radiculopathy 04/14/2017  . Spondylosis without myelopathy or radiculopathy, lumbar region 04/14/2017  . Attention deficit hyperactivity disorder (ADHD) 04/14/2017  . Moderate episode of recurrent major depressive disorder (HCC) 04/14/2017  . Generalized anxiety disorder 04/14/2017  . Chronic pain syndrome 04/14/2017  . PTSD (post-traumatic stress disorder) 04/14/2017  . Tachycardia 05/27/2013   Past Medical History:  Diagnosis Date  . Lumbar radiculopathy    Social History   Tobacco Use  . Smoking status: Never Smoker  . Smokeless tobacco: Never Used  Vaping Use  . Vaping Use: Never used  Substance Use Topics  . Alcohol use: No  . Drug use: No   No Known Allergies   Medications: Outpatient Medications Prior to Visit  Medication Sig  . clonazePAM (KLONOPIN) 0.5 MG tablet Take 0.5 mg by mouth 2 (two) times daily as needed.  Marland Kitchen ibuprofen (ADVIL,MOTRIN) 200 MG tablet Take 800 mg by mouth every 8 (eight) hours as needed.  Marland Kitchen oxyCODONE (OXY IR/ROXICODONE) 5 MG immediate release tablet Take 1 tablet (5 mg total) by mouth every 4 (four) hours as  needed for severe pain.  Marland Kitchen sertraline (ZOLOFT) 50 MG tablet Take 50 mg by mouth daily.   No facility-administered medications prior to visit.    Review of Systems     Objective    There were no vitals taken for this visit.    Physical Exam  ***  No results found for any visits on 01/15/21.  Assessment & Plan     ***  No follow-ups on file.      {provider attestation***:1}   Mila Merry, MD  [facility] [phone] (phone) [phone] (fax)  [facility]

## 2021-01-29 ENCOUNTER — Ambulatory Visit (INDEPENDENT_AMBULATORY_CARE_PROVIDER_SITE_OTHER): Payer: Self-pay | Admitting: Family Medicine

## 2021-01-29 ENCOUNTER — Other Ambulatory Visit: Payer: Self-pay

## 2021-01-29 VITALS — BP 128/84 | HR 109 | Ht 69.0 in | Wt 154.0 lb

## 2021-01-29 DIAGNOSIS — F4310 Post-traumatic stress disorder, unspecified: Secondary | ICD-10-CM

## 2021-01-29 DIAGNOSIS — M4800 Spinal stenosis, site unspecified: Secondary | ICD-10-CM

## 2021-01-29 DIAGNOSIS — F411 Generalized anxiety disorder: Secondary | ICD-10-CM

## 2021-01-29 DIAGNOSIS — M47816 Spondylosis without myelopathy or radiculopathy, lumbar region: Secondary | ICD-10-CM

## 2021-01-29 DIAGNOSIS — F1190 Opioid use, unspecified, uncomplicated: Secondary | ICD-10-CM

## 2021-01-29 DIAGNOSIS — F331 Major depressive disorder, recurrent, moderate: Secondary | ICD-10-CM
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
## 2021-02-01 ENCOUNTER — Other Ambulatory Visit: Payer: Self-pay | Admitting: Family Medicine

## 2021-02-01 ENCOUNTER — Encounter: Payer: Self-pay | Admitting: Family Medicine

## 2021-02-01 DIAGNOSIS — M47816 Spondylosis without myelopathy or radiculopathy, lumbar region: Secondary | ICD-10-CM

## 2021-02-01 DIAGNOSIS — G894 Chronic pain syndrome: Secondary | ICD-10-CM

## 2021-02-01 DIAGNOSIS — M5416 Radiculopathy, lumbar region: Secondary | ICD-10-CM

## 2021-02-01 MED ORDER — OXYCODONE HCL 5 MG PO TABS: 5.0000 mg | ORAL_TABLET | ORAL | 0 refills | Status: DC | PRN

## 2021-02-12 ENCOUNTER — Emergency Department (HOSPITAL_COMMUNITY): Payer: No Typology Code available for payment source

## 2021-02-12 ENCOUNTER — Encounter (HOSPITAL_COMMUNITY): Payer: Self-pay | Admitting: Emergency Medicine

## 2021-02-12 ENCOUNTER — Other Ambulatory Visit: Payer: Self-pay

## 2021-02-12 ENCOUNTER — Observation Stay (HOSPITAL_COMMUNITY)
Admission: EM | Admit: 2021-02-12 | Discharge: 2021-02-13 | Disposition: A | Discharge status: Home / Self Care | Payer: No Typology Code available for payment source | Attending: Surgery | Admitting: Surgery

## 2021-02-12 DIAGNOSIS — R109 Unspecified abdominal pain: Secondary | ICD-10-CM | POA: Insufficient documentation

## 2021-02-12 DIAGNOSIS — S065X9A Traumatic subdural hemorrhage with loss of consciousness of unspecified duration, initial encounter: Secondary | ICD-10-CM

## 2021-02-12 DIAGNOSIS — F10920 Alcohol use, unspecified with intoxication, uncomplicated: Secondary | ICD-10-CM

## 2021-02-12 DIAGNOSIS — S065X0A Traumatic subdural hemorrhage without loss of consciousness, initial encounter: Secondary | ICD-10-CM | POA: Insufficient documentation

## 2021-02-12 DIAGNOSIS — Y92410 Unspecified street and highway as the place of occurrence of the external cause: Secondary | ICD-10-CM | POA: Insufficient documentation

## 2021-02-12 DIAGNOSIS — Z79899 Other long term (current) drug therapy: Secondary | ICD-10-CM | POA: Insufficient documentation

## 2021-02-12 DIAGNOSIS — S62102A Fracture of unspecified carpal bone, left wrist, initial encounter for closed fracture: Secondary | ICD-10-CM

## 2021-02-12 DIAGNOSIS — Z20822 Contact with and (suspected) exposure to covid-19: Secondary | ICD-10-CM | POA: Insufficient documentation

## 2021-02-12 DIAGNOSIS — F1994 Other psychoactive substance use, unspecified with psychoactive substance-induced mood disorder: Secondary | ICD-10-CM

## 2021-02-12 DIAGNOSIS — S6292XA Unspecified fracture of left wrist and hand, initial encounter for closed fracture: Principal | ICD-10-CM | POA: Insufficient documentation

## 2021-02-12 DIAGNOSIS — S6992XA Unspecified injury of left wrist, hand and finger(s), initial encounter: Secondary | ICD-10-CM | POA: Diagnosis present

## 2021-02-12 DIAGNOSIS — S0083XA Contusion of other part of head, initial encounter: Secondary | ICD-10-CM | POA: Diagnosis not present

## 2021-02-12 DIAGNOSIS — Y900 Blood alcohol level of less than 20 mg/100 ml: Secondary | ICD-10-CM | POA: Diagnosis not present

## 2021-02-12 DIAGNOSIS — T07XXXA Unspecified multiple injuries, initial encounter: Secondary | ICD-10-CM

## 2021-02-12 DIAGNOSIS — S065XAA Traumatic subdural hemorrhage with loss of consciousness status unknown, initial encounter: Secondary | ICD-10-CM

## 2021-02-12 DIAGNOSIS — F1914 Other psychoactive substance abuse with psychoactive substance-induced mood disorder: Secondary | ICD-10-CM

## 2021-02-12 LAB — URINALYSIS, ROUTINE W REFLEX MICROSCOPIC
Bacteria, UA: NONE SEEN
Bilirubin Urine: NEGATIVE
Glucose, UA: NEGATIVE mg/dL
Hgb urine dipstick: NEGATIVE
Ketones, ur: NEGATIVE mg/dL
Leukocytes,Ua: NEGATIVE
Nitrite: POSITIVE — AB
Protein, ur: NEGATIVE mg/dL
Specific Gravity, Urine: 1.041 — ABNORMAL HIGH (ref 1.005–1.030)
pH: 7 (ref 5.0–8.0)

## 2021-02-12 LAB — CBC
HCT: 42.7 % (ref 36.0–46.0)
HCT: 39.8 % (ref 36.0–46.0)
Hemoglobin: 13.3 g/dL (ref 12.0–15.0)
Hemoglobin: 12.8 g/dL (ref 12.0–15.0)
MCH: 32.4 pg (ref 26.0–34.0)
MCH: 32 pg (ref 26.0–34.0)
MCHC: 31.1 g/dL (ref 30.0–36.0)
MCHC: 32.2 g/dL (ref 30.0–36.0)
MCV: 104.1 fL — ABNORMAL HIGH (ref 80.0–100.0)
MCV: 99.5 fL (ref 80.0–100.0)
Platelets: 248 10*3/uL (ref 150–400)
Platelets: 353 10*3/uL (ref 150–400)
RBC: 4.1 MIL/uL (ref 3.87–5.11)
RBC: 4 MIL/uL (ref 3.87–5.11)
RDW: 15 % (ref 11.5–15.5)
RDW: 14.9 % (ref 11.5–15.5)
WBC: 9.8 10*3/uL (ref 4.0–10.5)
WBC: 13 10*3/uL — ABNORMAL HIGH (ref 4.0–10.5)
nRBC: 0 % (ref 0.0–0.2)
nRBC: 0 % (ref 0.0–0.2)

## 2021-02-12 LAB — PROTIME-INR
INR: 0.9 (ref 0.8–1.2)
Prothrombin Time: 12.6 seconds (ref 11.4–15.2)

## 2021-02-12 LAB — I-STAT CHEM 8, ED
BUN: 8 mg/dL (ref 6–20)
Calcium, Ion: 0.95 mmol/L — ABNORMAL LOW (ref 1.15–1.40)
Chloride: 109 mmol/L (ref 98–111)
Creatinine, Ser: 0.8 mg/dL (ref 0.44–1.00)
Glucose, Bld: 115 mg/dL — ABNORMAL HIGH (ref 70–99)
HCT: 41 % (ref 36.0–46.0)
Hemoglobin: 13.9 g/dL (ref 12.0–15.0)
Potassium: 5.9 mmol/L — ABNORMAL HIGH (ref 3.5–5.1)
Sodium: 139 mmol/L (ref 135–145)
TCO2: 25 mmol/L (ref 22–32)

## 2021-02-12 LAB — COMPREHENSIVE METABOLIC PANEL
ALT: 13 U/L (ref 0–44)
AST: 21 U/L (ref 15–41)
Albumin: 3.4 g/dL — ABNORMAL LOW (ref 3.5–5.0)
Alkaline Phosphatase: 47 U/L (ref 38–126)
Anion gap: 7 (ref 5–15)
BUN: 8 mg/dL (ref 6–20)
CO2: 26 mmol/L (ref 22–32)
Calcium: 8.5 mg/dL — ABNORMAL LOW (ref 8.9–10.3)
Chloride: 103 mmol/L (ref 98–111)
Creatinine, Ser: 0.65 mg/dL (ref 0.44–1.00)
GFR, Estimated: >60 mL/min (ref >60)
Glucose, Bld: 116 mg/dL — ABNORMAL HIGH (ref 70–99)
Potassium: 3.8 mmol/L (ref 3.5–5.1)
Sodium: 136 mmol/L (ref 135–145)
Total Bilirubin: 0.4 mg/dL (ref 0.3–1.2)
Total Protein: 6.4 g/dL — ABNORMAL LOW (ref 6.5–8.1)

## 2021-02-12 LAB — RESP PANEL BY RT-PCR (FLU A&B, COVID) ARPGX2
Influenza A by PCR: NEGATIVE
Influenza B by PCR: NEGATIVE
SARS Coronavirus 2 by RT PCR: NEGATIVE

## 2021-02-12 LAB — BASIC METABOLIC PANEL
Anion gap: 6 (ref 5–15)
BUN: 6 mg/dL (ref 6–20)
CO2: 26 mmol/L (ref 22–32)
Calcium: 8.4 mg/dL — ABNORMAL LOW (ref 8.9–10.3)
Chloride: 107 mmol/L (ref 98–111)
Creatinine, Ser: 0.54 mg/dL (ref 0.44–1.00)
GFR, Estimated: >60 mL/min (ref >60)
Glucose, Bld: 104 mg/dL — ABNORMAL HIGH (ref 70–99)
Potassium: 3.8 mmol/L (ref 3.5–5.1)
Sodium: 139 mmol/L (ref 135–145)

## 2021-02-12 LAB — LACTIC ACID, PLASMA: Lactic Acid, Venous: 1.7 mmol/L (ref 0.5–1.9)

## 2021-02-12 LAB — MRSA NEXT GEN BY PCR, NASAL: MRSA by PCR Next Gen: NOT DETECTED

## 2021-02-12 LAB — ETHANOL: Alcohol, Ethyl (B): 154 mg/dL — ABNORMAL HIGH (ref <10)

## 2021-02-12 LAB — I-STAT BETA HCG BLOOD, ED (MC, WL, AP ONLY): I-stat hCG, quantitative: <5 m[IU]/mL (ref <5)

## 2021-02-12 LAB — HIV ANTIBODY (ROUTINE TESTING W REFLEX): HIV Screen 4th Generation wRfx: NONREACTIVE

## 2021-02-12 LAB — SAMPLE TO BLOOD BANK

## 2021-02-12 MED ORDER — ACETAMINOPHEN 325 MG PO TABS
650.0000 mg | ORAL_TABLET | ORAL | Status: DC | PRN
  Administered 2021-02-12: 650 mg via ORAL
  Filled 2021-02-12: qty 2

## 2021-02-12 MED ORDER — SODIUM CHLORIDE 0.9 % IV BOLUS
500.0000 mL | Freq: Once | INTRAVENOUS | Status: AC
  Administered 2021-02-12: 500 mL via INTRAVENOUS

## 2021-02-12 MED ORDER — IOHEXOL 300 MG/ML  SOLN
100.0000 mL | Freq: Once | INTRAMUSCULAR | Status: AC | PRN
  Administered 2021-02-12: 100 mL via INTRAVENOUS

## 2021-02-12 MED ORDER — LIDOCAINE HCL 2 % IJ SOLN: 20.0000 mL | Freq: Once | INTRAMUSCULAR | Status: DC

## 2021-02-12 MED ORDER — OXYCODONE HCL 5 MG/5ML PO SOLN: 5.0000 mg | ORAL | Status: DC | PRN

## 2021-02-12 MED ORDER — HALOPERIDOL LACTATE 5 MG/ML IJ SOLN
2.0000 mg | Freq: Once | INTRAMUSCULAR | Status: AC
  Administered 2021-02-12: 2 mg via INTRAVENOUS

## 2021-02-12 MED ORDER — CLONAZEPAM 0.5 MG PO TABS: 0.5000 mg | ORAL_TABLET | Freq: Two times a day (BID) | ORAL | Status: DC | PRN

## 2021-02-12 MED ORDER — HALOPERIDOL LACTATE 5 MG/ML IJ SOLN
INTRAMUSCULAR | Status: AC
  Filled 2021-02-12: qty 1

## 2021-02-12 MED ORDER — BISACODYL 10 MG RE SUPP: 10.0000 mg | Freq: Every day | RECTAL | Status: DC | PRN

## 2021-02-12 MED ORDER — HYDROMORPHONE HCL 1 MG/ML IJ SOLN
0.5000 mg | INTRAMUSCULAR | Status: DC | PRN
  Administered 2021-02-12: 1 mg via INTRAVENOUS
  Administered 2021-02-12: 0.5 mg via INTRAVENOUS
  Administered 2021-02-12: 1 mg via INTRAVENOUS
  Filled 2021-02-12 (×3): qty 1

## 2021-02-12 MED ORDER — LEVETIRACETAM 500 MG PO TABS
500.0000 mg | ORAL_TABLET | Freq: Two times a day (BID) | ORAL | Status: DC
  Administered 2021-02-12 – 2021-02-13 (×2): 500 mg via ORAL
  Filled 2021-02-12 (×2): qty 1

## 2021-02-12 MED ORDER — METHOCARBAMOL 500 MG PO TABS
1000.0000 mg | ORAL_TABLET | Freq: Three times a day (TID) | ORAL | Status: DC
  Administered 2021-02-12 – 2021-02-13 (×3): 1000 mg via ORAL
  Filled 2021-02-12 (×3): qty 2

## 2021-02-12 MED ORDER — DOCUSATE SODIUM 100 MG PO CAPS
100.0000 mg | ORAL_CAPSULE | Freq: Two times a day (BID) | ORAL | Status: DC
  Administered 2021-02-13: 100 mg via ORAL
  Filled 2021-02-12: qty 1

## 2021-02-12 MED ORDER — CHLORHEXIDINE GLUCONATE CLOTH 2 % EX PADS
6.0000 | MEDICATED_PAD | Freq: Every day | CUTANEOUS | Status: DC
  Administered 2021-02-12: 6 via TOPICAL

## 2021-02-12 MED ORDER — DEXTROSE-NACL 5-0.9 % IV SOLN: INTRAVENOUS | Status: DC

## 2021-02-12 MED ORDER — FENTANYL CITRATE (PF) 100 MCG/2ML IJ SOLN
25.0000 ug | Freq: Once | INTRAMUSCULAR | Status: AC
  Administered 2021-02-12: 25 ug via INTRAVENOUS
  Filled 2021-02-12: qty 2

## 2021-02-12 MED ORDER — OXYCODONE HCL 5 MG PO TABS
5.0000 mg | ORAL_TABLET | ORAL | Status: DC | PRN
  Administered 2021-02-12 – 2021-02-13 (×5): 10 mg via ORAL
  Administered 2021-02-13: 5 mg via ORAL
  Filled 2021-02-12 (×3): qty 2
  Filled 2021-02-12: qty 1
  Filled 2021-02-12 (×2): qty 2

## 2021-02-12 MED ORDER — DULOXETINE HCL 20 MG PO CPEP: 20.0000 mg | ORAL_CAPSULE | Freq: Every day | ORAL | Status: DC

## 2021-02-12 MED ORDER — SODIUM CHLORIDE 0.9 % IV SOLN: INTRAVENOUS | Status: DC

## 2021-02-12 MED ORDER — ACETAMINOPHEN 500 MG PO TABS
1000.0000 mg | ORAL_TABLET | Freq: Four times a day (QID) | ORAL | Status: DC
  Administered 2021-02-12 – 2021-02-13 (×4): 1000 mg via ORAL
  Filled 2021-02-12 (×4): qty 2

## 2021-02-12 MED ORDER — ONDANSETRON HCL 4 MG/2ML IJ SOLN: 4.0000 mg | Freq: Once | INTRAMUSCULAR | Status: DC

## 2021-02-12 MED ORDER — CEFAZOLIN SODIUM-DEXTROSE 2-4 GM/100ML-% IV SOLN
2.0000 g | Freq: Once | INTRAVENOUS | Status: AC
  Administered 2021-02-12: 2 g via INTRAVENOUS
  Filled 2021-02-12: qty 100

## 2021-02-12 MED ORDER — LEVETIRACETAM IN NACL 500 MG/100ML IV SOLN
500.0000 mg | Freq: Two times a day (BID) | INTRAVENOUS | Status: DC
  Administered 2021-02-12: 500 mg via INTRAVENOUS
  Filled 2021-02-12: qty 100

## 2021-02-12 MED ORDER — LEVETIRACETAM IN NACL 1000 MG/100ML IV SOLN
1000.0000 mg | Freq: Once | INTRAVENOUS | Status: AC
  Administered 2021-02-12: 1000 mg via INTRAVENOUS
  Filled 2021-02-12: qty 100

## 2021-02-12 NOTE — ED Notes (Signed)
Pt refused covid swab at this time
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
## 2021-02-12 NOTE — ED Notes (Signed)
Pt refusing to change into scrubs. Dr. Nicanor Alcon notified. Dr. Nicanor Alcon told this RN to just take everything out of room.

## 2021-02-12 NOTE — Plan of Care (Signed)
  Problem: Clinical Measurements: Goal: Ability to maintain clinical measurements within normal limits will improve Outcome: Progressing   

## 2021-02-12 NOTE — ED Notes (Signed)
Pt got dressed and left room. Pt brought back to room. Pt wanting to leave. MD, charge RN, and security at bedside. Med orders put in.
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
## 2021-02-12 NOTE — ED Notes (Signed)
Pt items inventoried. Will speak to floor about what they would like to do with them.
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
## 2021-02-12 NOTE — ED Notes (Addendum)
Per Malk RN (on 4N) would like pt belongings and valuables to come up with her

## 2021-02-12 NOTE — ED Notes (Signed)
Wrist reduced and put back in place by Dr. Nicanor Alcon - ortho placed splint - xray order put in - pt placed back on monitor

## 2021-02-12 NOTE — ED Notes (Signed)
Pt to CT
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
## 2021-02-12 NOTE — ED Notes (Signed)
Paged neurosurgery  

## 2021-02-12 NOTE — Progress Notes (Signed)
Orthopedic Tech Progress Note Patient Details:  Colleen Cook [facility] 1991/03/06 [id number]  Ortho Devices Type of Ortho Device: Arm sling, Sugartong splint Ortho Device/Splint Location: lue. I applied a sugartong splint post reduction with drs assistance. Ortho Device/Splint Interventions: Ordered, Application, Adjustment   Post Interventions Patient Tolerated: Well Instructions Provided: Care of device, Adjustment of device  Trinna Post 02/12/2021, 3:12 AM
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
## 2021-02-13 ENCOUNTER — Inpatient Hospital Stay (HOSPITAL_COMMUNITY): Payer: No Typology Code available for payment source

## 2021-02-13 MED ORDER — LEVETIRACETAM 500 MG PO TABS: 500.0000 mg | ORAL_TABLET | Freq: Two times a day (BID) | ORAL | 0 refills | Status: AC

## 2021-02-13 MED ORDER — OXYCODONE HCL 5 MG PO TABS: 5.0000 mg | ORAL_TABLET | ORAL | 0 refills | Status: DC | PRN

## 2021-02-13 MED ORDER — DOCUSATE SODIUM 100 MG PO CAPS: 100.0000 mg | ORAL_CAPSULE | Freq: Two times a day (BID) | ORAL | 1 refills | Status: AC

## 2021-02-13 MED ORDER — METHOCARBAMOL 500 MG PO TABS: 1000.0000 mg | ORAL_TABLET | Freq: Three times a day (TID) | ORAL | 1 refills | Status: AC
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
## 2021-02-13 NOTE — Plan of Care (Signed)
Patient adequate for d/c home today. Will see Dr. Carola Frost as outpatient and return for surgery of left wrist. VSS, IV removed. D/C paperwork given and all questions answered. All prescriptions sent to [facility], patient aware to pick up on her way home.
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
## 2021-02-13 NOTE — TOC CAGE-AID Note (Signed)
Transition of Care [facility]) - CAGE-AID Screening   Patient Details  Name: Colleen Cook MRN: [id number] Date of Birth: 1990-08-17   Hewitt Shorts, RN Trauma Response Nurse Phone [phone] 02/13/2021, 9:07 AM    CAGE-AID Screening:    Have You Ever Felt You Ought to Cut Down on Your Drinking or Drug Use?: No Have People Annoyed You By Critizing Your Drinking Or Drug Use?: No Have You Felt Bad Or Guilty About Your Drinking Or Drug Use?: No Have You Ever Had a Drink or Used Drugs First Thing In The Morning to Steady Your Nerves or to Get Rid of a Hangover?: No CAGE-AID Score: 0  Substance Abuse Education Offered: No (States only drinks socially)

## 2021-02-20 ENCOUNTER — Encounter (HOSPITAL_COMMUNITY): Payer: Self-pay | Admitting: Orthopedic Surgery

## 2021-02-20 NOTE — Progress Notes (Signed)
DUE TO COVID-19 ONLY ONE VISITOR IS ALLOWED TO COME WITH YOU AND STAY IN THE WAITING ROOM ONLY DURING PRE OP AND PROCEDURE DAY OF SURGERY.   PCP - Dr Mila Merry  Cardiologist - n/a  Chest x-ray - 02/12/21 (1V) EKG - n/a Stress Test - n/a ECHO - n/a Cardiac Cath - n/a  Sleep Study -  n/a CPAP - none  STOP now taking any Aspirin (unless otherwise instructed by your surgeon), Aleve, Naproxen, Ibuprofen, Motrin, Advil, Goody's, BC's, all herbal medications, fish oil, and all vitamins.   Coronavirus Screening Covid test n/a - Ambulatory Surgery  Do you have any of the following symptoms:  Cough yes/no: No Fever (>100.12F)  yes/no: No Runny nose yes/no: No Sore throat yes/no: No Difficulty breathing/shortness of breath  yes/no: No  Have you traveled in the last 14 days and where? yes/no: No  Patient verbalized understanding of instructions that were given via phone.
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
## 2021-02-21 ENCOUNTER — Ambulatory Visit (HOSPITAL_COMMUNITY): Payer: No Typology Code available for payment source | Admitting: Anesthesiology

## 2021-02-21 ENCOUNTER — Ambulatory Visit (HOSPITAL_COMMUNITY)
Admission: RE | Admit: 2021-02-21 | Discharge: 2021-02-21 | Disposition: A | Discharge status: Home / Self Care | Payer: No Typology Code available for payment source | Attending: Orthopedic Surgery | Admitting: Orthopedic Surgery

## 2021-02-21 ENCOUNTER — Ambulatory Visit (HOSPITAL_COMMUNITY): Payer: No Typology Code available for payment source

## 2021-02-21 ENCOUNTER — Encounter (HOSPITAL_COMMUNITY)
Admission: RE | Disposition: A | Discharge status: Home / Self Care | Payer: Self-pay | Source: Home / Self Care | Attending: Orthopedic Surgery

## 2021-02-21 ENCOUNTER — Encounter (HOSPITAL_COMMUNITY): Payer: Self-pay | Admitting: Orthopedic Surgery

## 2021-02-21 DIAGNOSIS — Z419 Encounter for procedure for purposes other than remedying health state, unspecified: Secondary | ICD-10-CM

## 2021-02-21 DIAGNOSIS — T148XXA Other injury of unspecified body region, initial encounter: Secondary | ICD-10-CM

## 2021-02-21 DIAGNOSIS — S52592A Other fractures of lower end of left radius, initial encounter for closed fracture: Secondary | ICD-10-CM | POA: Insufficient documentation

## 2021-02-21 HISTORY — DX: Other chronic pain: G89.29

## 2021-02-21 HISTORY — PX: OPEN REDUCTION INTERNAL FIXATION (ORIF) DISTAL RADIAL FRACTURE: SHX5989

## 2021-02-21 HISTORY — DX: Anxiety disorder, unspecified: F41.9

## 2021-02-21 HISTORY — DX: Depression, unspecified: F32.A

## 2021-02-21 LAB — POCT PREGNANCY, URINE: Preg Test, Ur: NEGATIVE

## 2021-02-21 LAB — VITAMIN D 25 HYDROXY (VIT D DEFICIENCY, FRACTURES): Vit D, 25-Hydroxy: 23.23 ng/mL — ABNORMAL LOW (ref 30–100)

## 2021-02-21 SURGERY — OPEN REDUCTION INTERNAL FIXATION (ORIF) DISTAL RADIUS FRACTURE
Anesthesia: Monitor Anesthesia Care | Laterality: Left

## 2021-02-21 MED ORDER — CELECOXIB 200 MG PO CAPS: 200.0000 mg | ORAL_CAPSULE | Freq: Once | ORAL | Status: AC

## 2021-02-21 MED ORDER — ACETAMINOPHEN 500 MG PO TABS: 1000.0000 mg | ORAL_TABLET | Freq: Once | ORAL | Status: AC

## 2021-02-21 MED ORDER — ACETAMINOPHEN 500 MG PO TABS: 1000.0000 mg | ORAL_TABLET | Freq: Three times a day (TID) | ORAL | 0 refills | Status: AC

## 2021-02-21 MED ORDER — ONDANSETRON 4 MG PO TBDP: 4.0000 mg | ORAL_TABLET | Freq: Three times a day (TID) | ORAL | 0 refills | Status: AC | PRN

## 2021-02-21 MED ORDER — CHLORHEXIDINE GLUCONATE 0.12 % MT SOLN: 15.0000 mL | Freq: Once | OROMUCOSAL | Status: AC

## 2021-02-21 MED ORDER — CELECOXIB 200 MG PO CAPS
ORAL_CAPSULE | ORAL | Status: AC
  Administered 2021-02-21: 200 mg via ORAL
  Filled 2021-02-21: qty 1

## 2021-02-21 MED ORDER — CLONIDINE HCL (ANALGESIA) 100 MCG/ML EP SOLN
EPIDURAL | Status: DC | PRN
  Administered 2021-02-21: 100 ug

## 2021-02-21 MED ORDER — ORAL CARE MOUTH RINSE
15.0000 mL | Freq: Once | OROMUCOSAL | Status: AC
Start: 2021-02-21 — End: 2021-02-21

## 2021-02-21 MED ORDER — BUPIVACAINE-EPINEPHRINE (PF) 0.5% -1:200000 IJ SOLN
INTRAMUSCULAR | Status: DC | PRN
  Administered 2021-02-21: 30 mL via PERINEURAL

## 2021-02-21 MED ORDER — FENTANYL CITRATE (PF) 250 MCG/5ML IJ SOLN
INTRAMUSCULAR | Status: AC
  Filled 2021-02-21: qty 5

## 2021-02-21 MED ORDER — FENTANYL CITRATE (PF) 250 MCG/5ML IJ SOLN
INTRAMUSCULAR | Status: DC | PRN
  Administered 2021-02-21 (×2): 50 ug via INTRAVENOUS

## 2021-02-21 MED ORDER — PROPOFOL 1000 MG/100ML IV EMUL
INTRAVENOUS | Status: AC
  Filled 2021-02-21: qty 200

## 2021-02-21 MED ORDER — PROPOFOL 10 MG/ML IV BOLUS
INTRAVENOUS | Status: AC
  Filled 2021-02-21: qty 40

## 2021-02-21 MED ORDER — CHLORHEXIDINE GLUCONATE 0.12 % MT SOLN
OROMUCOSAL | Status: AC
  Administered 2021-02-21: 15 mL via OROMUCOSAL
  Filled 2021-02-21: qty 15

## 2021-02-21 MED ORDER — FENTANYL CITRATE (PF) 100 MCG/2ML IJ SOLN
INTRAMUSCULAR | Status: AC
  Filled 2021-02-21: qty 2

## 2021-02-21 MED ORDER — ACETAMINOPHEN 500 MG PO TABS
ORAL_TABLET | ORAL | Status: AC
  Administered 2021-02-21: 1000 mg via ORAL
  Filled 2021-02-21: qty 2

## 2021-02-21 MED ORDER — AMISULPRIDE (ANTIEMETIC) 5 MG/2ML IV SOLN: 10.0000 mg | Freq: Once | INTRAVENOUS | Status: DC | PRN

## 2021-02-21 MED ORDER — VANCOMYCIN HCL 1000 MG IV SOLR
INTRAVENOUS | Status: AC
  Filled 2021-02-21: qty 1000

## 2021-02-21 MED ORDER — 0.9 % SODIUM CHLORIDE (POUR BTL) OPTIME
TOPICAL | Status: DC | PRN
  Administered 2021-02-21: 1000 mL

## 2021-02-21 MED ORDER — FENTANYL CITRATE (PF) 100 MCG/2ML IJ SOLN
25.0000 ug | INTRAMUSCULAR | Status: DC | PRN
  Administered 2021-02-21: 50 ug via INTRAVENOUS

## 2021-02-21 MED ORDER — MIDAZOLAM HCL 2 MG/2ML IJ SOLN
INTRAMUSCULAR | Status: AC
  Filled 2021-02-21: qty 2

## 2021-02-21 MED ORDER — PROPOFOL 10 MG/ML IV BOLUS
INTRAVENOUS | Status: DC | PRN
  Administered 2021-02-21: 50 mg via INTRAVENOUS
  Administered 2021-02-21: 30 mg via INTRAVENOUS
  Administered 2021-02-21: 50 mg via INTRAVENOUS

## 2021-02-21 MED ORDER — CEFAZOLIN SODIUM-DEXTROSE 2-4 GM/100ML-% IV SOLN
2.0000 g | INTRAVENOUS | Status: AC
  Administered 2021-02-21: 2 g via INTRAVENOUS
  Filled 2021-02-21: qty 100

## 2021-02-21 MED ORDER — PROMETHAZINE HCL 25 MG/ML IJ SOLN: 6.2500 mg | INTRAMUSCULAR | Status: DC | PRN

## 2021-02-21 MED ORDER — MIDAZOLAM HCL 5 MG/5ML IJ SOLN
INTRAMUSCULAR | Status: DC | PRN
  Administered 2021-02-21: 2 mg via INTRAVENOUS

## 2021-02-21 MED ORDER — PHENYLEPHRINE HCL-NACL 10-0.9 MG/250ML-% IV SOLN
INTRAVENOUS | Status: DC | PRN
  Administered 2021-02-21: 30 ug/min via INTRAVENOUS

## 2021-02-21 MED ORDER — PROPOFOL 500 MG/50ML IV EMUL
INTRAVENOUS | Status: DC | PRN
  Administered 2021-02-21: 125 ug/kg/min via INTRAVENOUS

## 2021-02-21 MED ORDER — LACTATED RINGERS IV SOLN: INTRAVENOUS | Status: DC

## 2021-02-21 SURGICAL SUPPLY — 61 items
BAG COUNTER SPONGE SURGICOUNT (BAG) ×2 IMPLANT
BIT DRILL 2.2 SS TIBIAL (BIT) ×2 IMPLANT
BLADE CLIPPER SURG (BLADE) ×2 IMPLANT
BNDG ELASTIC 3X5.8 VLCR STR LF (GAUZE/BANDAGES/DRESSINGS) ×2 IMPLANT
BNDG ELASTIC 4X5.8 VLCR STR LF (GAUZE/BANDAGES/DRESSINGS) ×2 IMPLANT
BNDG ESMARK 4X9 LF (GAUZE/BANDAGES/DRESSINGS) ×2 IMPLANT
BRUSH SCRUB EZ PLAIN DRY (MISCELLANEOUS) ×4 IMPLANT
COVER SURGICAL LIGHT HANDLE (MISCELLANEOUS) ×4 IMPLANT
DECANTER SPIKE VIAL GLASS SM (MISCELLANEOUS) IMPLANT
DRAPE C-ARM 35X43 STRL (DRAPES) ×2 IMPLANT
DRAPE C-ARMOR (DRAPES) ×2 IMPLANT
DRAPE OEC MINIVIEW 54X84 (DRAPES) IMPLANT
DRSG EMULSION OIL 3X3 NADH (GAUZE/BANDAGES/DRESSINGS) ×2 IMPLANT
ELECT REM PT RETURN 9FT ADLT (ELECTROSURGICAL) ×2
ELECTRODE REM PT RTRN 9FT ADLT (ELECTROSURGICAL) ×1 IMPLANT
GAUZE SPONGE 4X4 12PLY STRL (GAUZE/BANDAGES/DRESSINGS) ×2 IMPLANT
GLOVE SRG 8 PF TXTR STRL LF DI (GLOVE) ×1 IMPLANT
GLOVE SURG ENC MOIS LTX SZ7.5 (GLOVE) ×2 IMPLANT
GLOVE SURG ENC MOIS LTX SZ8 (GLOVE) ×2 IMPLANT
GLOVE SURG UNDER POLY LF SZ7.5 (GLOVE) ×2 IMPLANT
GLOVE SURG UNDER POLY LF SZ8 (GLOVE) ×1
GOWN STRL REUS W/ TWL LRG LVL3 (GOWN DISPOSABLE) ×2 IMPLANT
GOWN STRL REUS W/ TWL XL LVL3 (GOWN DISPOSABLE) ×1 IMPLANT
GOWN STRL REUS W/TWL LRG LVL3 (GOWN DISPOSABLE) ×2
GOWN STRL REUS W/TWL XL LVL3 (GOWN DISPOSABLE) ×1
K-WIRE 1.6 (WIRE) ×3
K-WIRE FX5X1.6XNS BN SS (WIRE) ×3
KIT BASIN OR (CUSTOM PROCEDURE TRAY) ×2 IMPLANT
KIT TURNOVER KIT B (KITS) ×2 IMPLANT
KWIRE FX5X1.6XNS BN SS (WIRE) ×3 IMPLANT
MANIFOLD NEPTUNE II (INSTRUMENTS) ×2 IMPLANT
NEEDLE HYPO 25GX1X1/2 BEV (NEEDLE) IMPLANT
NS IRRIG 1000ML POUR BTL (IV SOLUTION) ×2 IMPLANT
PACK ORTHO EXTREMITY (CUSTOM PROCEDURE TRAY) ×2 IMPLANT
PAD ARMBOARD 7.5X6 YLW CONV (MISCELLANEOUS) ×4 IMPLANT
PAD CAST 3X4 CTTN HI CHSV (CAST SUPPLIES) ×1 IMPLANT
PADDING CAST COTTON 3X4 STRL (CAST SUPPLIES) ×1
PEG LOCKING SMOOTH 2.2X20 (Screw) ×2 IMPLANT
PEG LOCKING SMOOTH 2.2X22 (Screw) ×6 IMPLANT
PEG LOCKING SMOOTH 2.2X24 (Peg) ×6 IMPLANT
PEG LOCKING SMOOTH 2.2X26 (Peg) ×4 IMPLANT
PLATE STANDARD DVR LEFT (Plate) ×2 IMPLANT
PLATE STD DVR LT 24X51 (Plate) ×1 IMPLANT
SCREW LOCK 14X2.7X 3 LD TPR (Screw) ×2 IMPLANT
SCREW LOCK 16X2.7X 3 LD TPR (Screw) ×3 IMPLANT
SCREW LOCKING 2.7X14 (Screw) ×2 IMPLANT
SCREW LOCKING 2.7X16 (Screw) ×3 IMPLANT
STAPLER VISISTAT 35W (STAPLE) IMPLANT
SUCTION FRAZIER HANDLE 10FR (MISCELLANEOUS)
SUCTION TUBE FRAZIER 10FR DISP (MISCELLANEOUS) IMPLANT
SUT ETHILON 3 0 PS 1 (SUTURE) ×4 IMPLANT
SUT PROLENE 0 CT (SUTURE) IMPLANT
SUT VIC AB 2-0 CT1 27 (SUTURE) ×1
SUT VIC AB 2-0 CT1 TAPERPNT 27 (SUTURE) ×1 IMPLANT
SUT VIC AB 2-0 CT3 27 (SUTURE) IMPLANT
SYR CONTROL 10ML LL (SYRINGE) IMPLANT
TOWEL GREEN STERILE (TOWEL DISPOSABLE) ×4 IMPLANT
TOWEL GREEN STERILE FF (TOWEL DISPOSABLE) ×2 IMPLANT
TUBE CONNECTING 12X1/4 (SUCTIONS) ×2 IMPLANT
UNDERPAD 30X36 HEAVY ABSORB (UNDERPADS AND DIAPERS) ×2 IMPLANT
WATER STERILE IRR 1000ML POUR (IV SOLUTION) ×2 IMPLANT

## 2021-02-21 NOTE — Anesthesia Procedure Notes (Signed)
Procedure Name: MAC Date/Time: 02/21/2021 8:25 AM Performed by: Griffin Dakin, CRNA Pre-anesthesia Checklist: Patient identified, Emergency Drugs available, Suction available, Patient being monitored and Timeout performed Patient Re-evaluated:Patient Re-evaluated prior to induction Oxygen Delivery Method: Simple face mask Induction Type: IV induction Placement Confirmation: positive ETCO2 and breath sounds checked- equal and bilateral Dental Injury: Teeth and Oropharynx as per pre-operative assessment

## 2021-02-21 NOTE — Discharge Instructions (Signed)
[facility]uma Service Discharge Instructions   General Discharge Instructions  Orthopaedic Injuries:  Left distal radius fracture treated with open reduction and internal fixation using plate and screws   WEIGHT BEARING STATUS: Nonweightbearing Left wrist, no lifting with left arm, no pushing or pulling   RANGE OF MOTION/ACTIVITY: unrestricted motion of fingers and elbow. Do not remove splint   Bone health:  labs show vitamin d insufficiency.  Take 5000 IUs vitamin d3 daily   Wound Care: no not remove splint. Keep splint clean and dry. We will remove splint at your first follow up appointment    Diet: as you were eating previously.  Can use over the counter stool softeners and bowel preparations, such as Miralax, to help with bowel movements.  Narcotics can be constipating.  Be sure to drink plenty of fluids  PAIN MEDICATION USE AND EXPECTATIONS  You have likely been given narcotic medications to help control your pain.  After a traumatic event that results in an fracture (broken bone) with or without surgery, it is ok to use narcotic pain medications to help control one's pain.  We understand that everyone responds to pain differently and each individual patient will be evaluated on a regular basis for the continued need for narcotic medications. Ideally, narcotic medication use should last no more than 6-8 weeks (coinciding with fracture healing).   As a patient it is your responsibility as well to monitor narcotic medication use and report the amount and frequency you use these medications when you come to your office visit.   We would also advise that if you are using narcotic medications, you should take a dose prior to therapy to maximize you participation.  IF YOU ARE ON NARCOTIC MEDICATIONS IT IS NOT PERMISSIBLE TO OPERATE A MOTOR VEHICLE (MOTORCYCLE/CAR/TRUCK/MOPED) OR HEAVY MACHINERY DO NOT MIX NARCOTICS WITH OTHER CNS (CENTRAL NERVOUS SYSTEM) DEPRESSANTS SUCH AS  ALCOHOL   POST-OPERATIVE OPIOID TAPER INSTRUCTIONS: It is important to wean off of your opioid medication as soon as possible. If you do not need pain medication after your surgery it is ok to stop day one. Opioids include: Codeine, Hydrocodone(Norco, Vicodin), Oxycodone(Percocet, oxycontin) and hydromorphone amongst others.  Long term and even short term use of opiods can cause: Increased pain response Dependence Constipation Depression Respiratory depression And more.  Withdrawal symptoms can include Flu like symptoms Nausea, vomiting And more Techniques to manage these symptoms Hydrate well Eat regular healthy meals Stay active Use relaxation techniques(deep breathing, meditating, yoga) Do Not substitute Alcohol to help with tapering If you have been on opioids for less than two weeks and do not have pain than it is ok to stop all together.  Plan to wean off of opioids This plan should start within one week post op of your fracture surgery  Maintain the same interval or time between taking each dose and first decrease the dose.  Cut the total daily intake of opioids by one tablet each day Next start to increase the time between doses. The last dose that should be eliminated is the evening dose.    STOP SMOKING OR USING NICOTINE PRODUCTS!!!!  As discussed nicotine severely impairs your body's ability to heal surgical and traumatic wounds but also impairs bone healing.  Wounds and bone heal by forming microscopic blood vessels (angiogenesis) and nicotine is a vasoconstrictor (essentially, shrinks blood vessels).  Therefore, if vasoconstriction occurs to these microscopic blood vessels they essentially disappear and are unable to deliver necessary nutrients to the healing tissue.  This is one modifiable factor that you can do to dramatically increase your chances of healing your injury.    (This means no smoking, no nicotine gum, patches, etc)  DO NOT USE NONSTEROIDAL  ANTI-INFLAMMATORY DRUGS (NSAID'S)  Using products such as Advil (ibuprofen), Aleve (naproxen), Motrin (ibuprofen) for additional pain control during fracture healing can delay and/or prevent the healing response.  If you would like to take over the counter (OTC) medication, Tylenol (acetaminophen) is ok.  However, some narcotic medications that are given for pain control contain acetaminophen as well. Therefore, you should not exceed more than 4000 mg of tylenol in a day if you do not have liver disease.  Also note that there are may OTC medicines, such as cold medicines and allergy medicines that my contain tylenol as well.  If you have any questions about medications and/or interactions please ask your doctor/PA or your pharmacist.      ICE AND ELEVATE INJURED/OPERATIVE EXTREMITY  Using ice and elevating the injured extremity above your heart can help with swelling and pain control.  Icing in a pulsatile fashion, such as 20 minutes on and 20 minutes off, can be followed.    Do not place ice directly on skin. Make sure there is a barrier between to skin and the ice pack.    Using frozen items such as frozen peas works well as the conform nicely to the are that needs to be iced.  USE AN ACE WRAP OR TED HOSE FOR SWELLING CONTROL  In addition to icing and elevation, Ace wraps or TED hose are used to help limit and resolve swelling.  It is recommended to use Ace wraps or TED hose until you are informed to stop.    When using Ace Wraps start the wrapping distally (farthest away from the body) and wrap proximally (closer to the body)   Example: If you had surgery on your leg or thing and you do not have a splint on, start the ace wrap at the toes and work your way up to the thigh        If you had surgery on your upper extremity and do not have a splint on, start the ace wrap at your fingers and work your way up to the upper arm  IF YOU ARE IN A SPLINT OR CAST DO NOT REMOVE IT FOR ANY REASON   If your  splint gets wet for any reason please contact the office immediately. You may shower in your splint or cast as long as you keep it dry.  This can be done by wrapping in a cast cover or garbage back (or similar)  Do Not stick any thing down your splint or cast such as pencils, money, or hangers to try and scratch yourself with.  If you feel itchy take benadryl as prescribed on the bottle for itching  IF YOU ARE IN A CAM BOOT (BLACK BOOT)  You may remove boot periodically. Perform daily dressing changes as noted below.  Wash the liner of the boot regularly and wear a sock when wearing the boot. It is recommended that you sleep in the boot until told otherwise    Call office for the following: Temperature greater than 101F Persistent nausea and vomiting Severe uncontrolled pain Redness, tenderness, or signs of infection (pain, swelling, redness, odor or green/yellow discharge around the site) Difficulty breathing, headache or visual disturbances Hives Persistent dizziness or light-headedness Extreme fatigue Any other questions or concerns you may have after  discharge  In an emergency, call 911 or go to an Emergency Department at a nearby hospital  HELPFUL INFORMATION  If you had a block, it will wear off between 8-24 hrs postop typically.  This is period when your pain may go from nearly zero to the pain you would have had postop without the block.  This is an abrupt transition but nothing dangerous is happening.  You may take an extra dose of narcotic when this happens.  You should wean off your narcotic medicines as soon as you are able.  Most patients will be off or using minimal narcotics before their first postop appointment.   We suggest you use the pain medication the first night prior to going to bed, in order to ease any pain when the anesthesia wears off. You should avoid taking pain medications on an empty stomach as it will make you nauseous.  Do not drink alcoholic beverages or  take illicit drugs when taking pain medications.  In most states it is against the law to drive while you are in a splint or sling.  And certainly against the law to drive while taking narcotics.  You may return to work/school in the next couple of days when you feel up to it.   Pain medication may make you constipated.  Below are a few solutions to try in this order: Decrease the amount of pain medication if you aren't having pain. Drink lots of decaffeinated fluids. Drink prune juice and/or each dried prunes  If the first 3 don't work start with additional solutions Take Colace - an over-the-counter stool softener Take Senokot - an over-the-counter laxative Take Miralax - a stronger over-the-counter laxative     CALL THE OFFICE WITH ANY QUESTIONS OR CONCERNS: [phone]   VISIT OUR WEBSITE FOR ADDITIONAL INFORMATION: [email or web address]

## 2021-02-21 NOTE — Progress Notes (Signed)
Orthopedic Tech Progress Note Patient Details:  Colleen Cook [facility] 04/13/1991 [id number]  Ortho Devices Type of Ortho Device: Arm sling Ortho Device/Splint Interventions: Ordered      [name] A Pang Robers 02/21/2021, 10:34 AM

## 2021-02-21 NOTE — Transfer of Care (Signed)
Immediate Anesthesia Transfer of Care Note  Patient: Colleen Cook  Procedure(s) Performed: OPEN REDUCTION INTERNAL FIXATION (ORIF) DISTAL RADIAL FRACTURE (Left)  Patient Location: PACU  Anesthesia Type:MAC and Regional  Level of Consciousness: awake, alert  and oriented  Airway & Oxygen Therapy: Patient Spontanous Breathing and Patient connected to face mask oxygen  Post-op Assessment: Report given to RN and Post -op Vital signs reviewed and stable  Post vital signs: Reviewed and stable  Last Vitals:  Vitals Value Taken Time  BP 107/59 02/21/21 1008  Temp 36.3 C 02/21/21 1008  Pulse 76 02/21/21 1021  Resp 14 02/21/21 1021  SpO2 99 % 02/21/21 1021  Vitals shown include unvalidated device data.  Last Pain:  Vitals:   02/21/21 1008  PainSc: 6       Patients Stated Pain Goal: 2 (61/53/79 [id number])  Complications: No notable events documented.

## 2021-02-21 NOTE — Op Note (Signed)
OPERATIVE REPORT  Colleen Cook [id number]  DATE OF PROCEDURE:  02/21/2021  PREOPERATIVE DIAGNOSES:  COMMINUTED LEFT DISTAL RADIUS FRACTURE.  POSTOPERATIVE DIAGNOSES:  COMMINUTED LEFT DISTAL RADIUS FRACTURE.  PROCEDURE:  OPEN REDUCTION INTERNAL FIXATION OF LEFT DISTAL RADIUS.  SURGEON:  Myrene Galas, MD  ASSISTANT:  NONE.  ANESTHESIA:  General.  COMPLICATIONS:  None.  TOURNIQUET:  None.  DISPOSITION:  To PACU.  CONDITION:  Stable.  BRIEF SUMMARY AND INDICATIONS FOR PROCEDURE:  The patient is a 30 y.o. who sustained a distal radius fracture. Because of the angulation and displacement in addition to other factors, I recommended internal fixation. I discussed with the patient the risks and benefits of surgery including the possibility of nerve injury, vessel injury, infection, DVT, PE, failure to regain motion, arthritis, nonunion, malunion, anesthetic complications, stroke, heart attack, infection,  and multiple others. After acknowledging these risks consent was provided to proceed.   SUMMARY OF PROCEDURE:  After administration of antibiotics and a regional nerve block the patient was taken to the operating room and sedated.  The operative upper extremity was prepped and draped in the usual sterile fashion.  No tourniquet was used during the procedure.  After a timeout, standard volar approach was made to the distal radius, avoiding straight extension of the incision over the flexor crease.  The deep aspect of the flexor carpi radialis tendon sheath was incised and then the tendon and radial artery retracted radially for protection.  The radial edge of the pronator was incised and the muscle belly swept ulnarly with a Bennett retractor carefully placed there. I did place towels underneath the carpus to help restore tilt, but it was quite difficult to restore full length and articular realignment and so an assistant was necessary to obtain and maintain reduction with traction  in addition to retracting.  I placed pins through the plate into the pain and checked them for position. I then placed the distal row of pegs with the plate flexed volarly off the metaphysis. As I brought the plate down to the bone, appropriate tilt, inclination were restored. I placed a screw in the slotted hole of the metaphysis to allow fine tuning of plate position, pinned it provisionally to check with c-arm, then placed the additional screws. The ulna reduced quite nicely and as a result did not require additional fixation.  Shuck test of the DRUJ did not suggest instability that would require fixation.  Wound was irrigated thoroughly and then the wound closed in standard layered fashion using 0 Vicryl, 2-0 Vicryl and 3-0 nylon for the skin.  Sterile gently compressive dressing was applied and then a volar splint.  The patient was awakened from the anesthesia and transported to PACU in stable condition.  PROGNOSIS:  The patient will be in a sling with ice, elevation (hand above the elbow and elbow above the heart), and unrestricted range of motion of the digits and elbow.  Plan in 10--14 days to remove sutures and convert into removable splint.
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
## 2021-02-21 NOTE — Anesthesia Procedure Notes (Signed)
Anesthesia Regional Block: Supraclavicular block   Pre-Anesthetic Checklist: , timeout performed,  Correct Patient, Correct Site, Correct Laterality,  Correct Procedure, Correct Position, site marked,  Risks and benefits discussed,  Surgical consent,  Pre-op evaluation,  At surgeon's request and post-op pain management  Laterality: Left and Upper  Prep: chloraprep       Needles:  Injection technique: Single-shot      Needle Length: 5cm  Needle Gauge: 22     Additional Needles: Arrow StimuQuik ECHO Echogenic Stimulating PNB Needle  Procedures:,,,, ultrasound used (permanent image in chart),,    Narrative:  Start time: 02/21/2021 7:48 AM End time: 02/21/2021 7:53 AM Injection made incrementally with aspirations every 5 mL.  Performed by: Personally  Anesthesiologist: Val Eagle, MD

## 2021-02-21 NOTE — Anesthesia Postprocedure Evaluation (Signed)
Anesthesia Post Note  Patient: Colleen Cook  Procedure(s) Performed: OPEN REDUCTION INTERNAL FIXATION (ORIF) DISTAL RADIAL FRACTURE (Left)     Patient location during evaluation: PACU Anesthesia Type: MAC Level of consciousness: awake and alert Pain management: pain level controlled Vital Signs Assessment: post-procedure vital signs reviewed and stable Respiratory status: spontaneous breathing and respiratory function stable Cardiovascular status: stable Postop Assessment: no apparent nausea or vomiting Anesthetic complications: no   No notable events documented.  Last Vitals:  Vitals:   02/21/21 1038 02/21/21 1050  BP: 112/72 115/77  Pulse: 80 83  Resp: 13 17  Temp:  36.7 C  SpO2: 98% 99%    Last Pain:  Vitals:   02/21/21 1050  PainSc: 2                  Treavor Blomquist DANIEL

## 2021-02-26 ENCOUNTER — Encounter (HOSPITAL_COMMUNITY): Payer: Self-pay | Admitting: Orthopedic Surgery

## 2021-02-28 ENCOUNTER — Other Ambulatory Visit: Payer: Self-pay | Admitting: Family Medicine

## 2021-02-28 DIAGNOSIS — M5416 Radiculopathy, lumbar region: Secondary | ICD-10-CM

## 2021-02-28 DIAGNOSIS — M47816 Spondylosis without myelopathy or radiculopathy, lumbar region: Secondary | ICD-10-CM

## 2021-02-28 DIAGNOSIS — G894 Chronic pain syndrome: Secondary | ICD-10-CM

## 2021-03-01 ENCOUNTER — Other Ambulatory Visit: Payer: Self-pay | Admitting: Family Medicine

## 2021-03-01 DIAGNOSIS — M5416 Radiculopathy, lumbar region: Secondary | ICD-10-CM

## 2021-03-01 DIAGNOSIS — G894 Chronic pain syndrome: Secondary | ICD-10-CM

## 2021-03-01 DIAGNOSIS — M47816 Spondylosis without myelopathy or radiculopathy, lumbar region: Secondary | ICD-10-CM

## 2021-03-02 MED ORDER — OXYCODONE HCL 5 MG PO TABS: 5.0000 mg | ORAL_TABLET | ORAL | 0 refills | Status: DC | PRN
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
## 2021-03-29 ENCOUNTER — Encounter: Payer: Self-pay | Admitting: Family Medicine

## 2021-03-29 ENCOUNTER — Other Ambulatory Visit: Payer: Self-pay | Admitting: Family Medicine

## 2021-03-29 DIAGNOSIS — G894 Chronic pain syndrome: Secondary | ICD-10-CM

## 2021-03-29 DIAGNOSIS — M5416 Radiculopathy, lumbar region: Secondary | ICD-10-CM

## 2021-03-29 DIAGNOSIS — M47816 Spondylosis without myelopathy or radiculopathy, lumbar region: Secondary | ICD-10-CM

## 2021-03-29 NOTE — Telephone Encounter (Signed)
Please advise on refill request. Patient also sent mychart message requesting a refill on this medication.

## 2021-03-29 NOTE — Telephone Encounter (Signed)
Medication: oxyCODONE (OXY IR/ROXICODONE) 5 MG immediate release tablet [[id number]]   Has the patient contacted their pharmacy? YES (Agent: If no, request that the patient contact the pharmacy for the refill.) (Agent: If yes, when and what did the pharmacy advise?)  Preferred Pharmacy (with phone number or street name): [facility] [address] [place], [place] [address] [phone]  Agent: Please be advised that RX refills may take up to 3 business days. We ask that you follow-up with your pharmacy.

## 2021-04-02 ENCOUNTER — Other Ambulatory Visit: Payer: Self-pay | Admitting: Family Medicine

## 2021-04-02 ENCOUNTER — Encounter: Payer: Self-pay | Admitting: Family Medicine

## 2021-04-02 DIAGNOSIS — G894 Chronic pain syndrome: Secondary | ICD-10-CM

## 2021-04-02 DIAGNOSIS — M47816 Spondylosis without myelopathy or radiculopathy, lumbar region: Secondary | ICD-10-CM

## 2021-04-02 DIAGNOSIS — M5416 Radiculopathy, lumbar region: Secondary | ICD-10-CM

## 2021-04-02 MED ORDER — OXYCODONE HCL 5 MG PO TABS: 5.0000 mg | ORAL_TABLET | ORAL | 0 refills | Status: AC | PRN

## 2021-04-02 NOTE — Telephone Encounter (Signed)
Pt called to follow up on refill request. She wanted me to let you know she is out of mediation. Hopes to get refill today.  Please note new pharmacy  [facility] [id number] - [place], [place] - [address]

## 2021-04-17 ENCOUNTER — Other Ambulatory Visit: Payer: Self-pay

## 2021-04-17 ENCOUNTER — Emergency Department (HOSPITAL_COMMUNITY)
Admission: EM | Admit: 2021-04-17 | Discharge: 2021-04-18 | Disposition: A | Discharge status: Home / Self Care | Payer: Self-pay | Attending: Emergency Medicine | Admitting: Emergency Medicine

## 2021-04-17 ENCOUNTER — Emergency Department (HOSPITAL_COMMUNITY): Payer: Self-pay

## 2021-04-17 ENCOUNTER — Encounter (HOSPITAL_COMMUNITY): Payer: Self-pay

## 2021-04-17 DIAGNOSIS — T424X1A Poisoning by benzodiazepines, accidental (unintentional), initial encounter: Secondary | ICD-10-CM | POA: Insufficient documentation

## 2021-04-17 DIAGNOSIS — R410 Disorientation, unspecified: Secondary | ICD-10-CM | POA: Insufficient documentation

## 2021-04-17 DIAGNOSIS — J181 Lobar pneumonia, unspecified organism: Secondary | ICD-10-CM | POA: Insufficient documentation

## 2021-04-17 DIAGNOSIS — J189 Pneumonia, unspecified organism: Secondary | ICD-10-CM

## 2021-04-17 DIAGNOSIS — Z20822 Contact with and (suspected) exposure to covid-19: Secondary | ICD-10-CM | POA: Insufficient documentation

## 2021-04-17 DIAGNOSIS — Y900 Blood alcohol level of less than 20 mg/100 ml: Secondary | ICD-10-CM | POA: Insufficient documentation

## 2021-04-17 DIAGNOSIS — T50901A Poisoning by unspecified drugs, medicaments and biological substances, accidental (unintentional), initial encounter: Secondary | ICD-10-CM

## 2021-04-17 LAB — I-STAT CHEM 8, ED
BUN: 12 mg/dL (ref 6–20)
Calcium, Ion: 1.21 mmol/L (ref 1.15–1.40)
Chloride: 104 mmol/L (ref 98–111)
Creatinine, Ser: 1.1 mg/dL — ABNORMAL HIGH (ref 0.44–1.00)
Glucose, Bld: 194 mg/dL — ABNORMAL HIGH (ref 70–99)
HCT: 42 % (ref 36.0–46.0)
Hemoglobin: 14.3 g/dL (ref 12.0–15.0)
Potassium: 4.1 mmol/L (ref 3.5–5.1)
Sodium: 140 mmol/L (ref 135–145)
TCO2: 27 mmol/L (ref 22–32)

## 2021-04-17 LAB — CBG MONITORING, ED: Glucose-Capillary: 256 mg/dL — ABNORMAL HIGH (ref 70–99)

## 2021-04-17 MED ORDER — SODIUM CHLORIDE 0.9 % IV BOLUS
1000.0000 mL | Freq: Once | INTRAVENOUS | Status: AC
  Administered 2021-04-18: 1000 mL via INTRAVENOUS

## 2021-04-17 NOTE — ED Triage Notes (Signed)
BIB [facility] called out for unresponsive. Per roommate pt has a script for Xanax took 2 instead of 1. Unresponsive on EMS arrival. Became cool, pale, diaphoretic and gave pt Narcan 2mg  IN, PIV obtained in LtAC 18ga with NS . Pt is responsive however crying and upset. Difficult to understand at this time.

## 2021-04-17 NOTE — ED Notes (Signed)
Provider at bedside

## 2021-04-17 NOTE — ED Notes (Signed)
Pt provided multiple warm blankets and black moving tarp removed

## 2021-04-17 NOTE — ED Notes (Signed)
Radiology at bedside

## 2021-04-17 NOTE — ED Notes (Signed)
Per pt she is not currently prescribed xanax. And that she took xanax bars and took 2 bars but denies taking anything else
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
## 2021-04-18 LAB — COMPREHENSIVE METABOLIC PANEL
ALT: 16 U/L (ref 0–44)
AST: 21 U/L (ref 15–41)
Albumin: 3.6 g/dL (ref 3.5–5.0)
Alkaline Phosphatase: 45 U/L (ref 38–126)
Anion gap: 6 (ref 5–15)
BUN: 12 mg/dL (ref 6–20)
CO2: 26 mmol/L (ref 22–32)
Calcium: 8.4 mg/dL — ABNORMAL LOW (ref 8.9–10.3)
Chloride: 106 mmol/L (ref 98–111)
Creatinine, Ser: 1.12 mg/dL — ABNORMAL HIGH (ref 0.44–1.00)
GFR, Estimated: >60 mL/min (ref >60)
Glucose, Bld: 195 mg/dL — ABNORMAL HIGH (ref 70–99)
Potassium: 4 mmol/L (ref 3.5–5.1)
Sodium: 138 mmol/L (ref 135–145)
Total Bilirubin: 1.3 mg/dL — ABNORMAL HIGH (ref 0.3–1.2)
Total Protein: 6.6 g/dL (ref 6.5–8.1)

## 2021-04-18 LAB — RAPID URINE DRUG SCREEN, HOSP PERFORMED
Amphetamines: POSITIVE — AB
Barbiturates: NOT DETECTED
Benzodiazepines: POSITIVE — AB
Cocaine: POSITIVE — AB
Opiates: NOT DETECTED
Tetrahydrocannabinol: POSITIVE — AB

## 2021-04-18 LAB — URINALYSIS, ROUTINE W REFLEX MICROSCOPIC
Bilirubin Urine: NEGATIVE
Glucose, UA: 150 mg/dL — AB
Hgb urine dipstick: NEGATIVE
Ketones, ur: NEGATIVE mg/dL
Leukocytes,Ua: NEGATIVE
Nitrite: NEGATIVE
Protein, ur: NEGATIVE mg/dL
Specific Gravity, Urine: 1.015 (ref 1.005–1.030)
pH: 5 (ref 5.0–8.0)

## 2021-04-18 LAB — CBC WITH DIFFERENTIAL/PLATELET
Abs Immature Granulocytes: 0.27 10*3/uL — ABNORMAL HIGH (ref 0.00–0.07)
Basophils Absolute: 0.1 10*3/uL (ref 0.0–0.1)
Basophils Relative: 1 %
Eosinophils Absolute: 0.1 10*3/uL (ref 0.0–0.5)
Eosinophils Relative: 0 %
HCT: 41.5 % (ref 36.0–46.0)
Hemoglobin: 13.5 g/dL (ref 12.0–15.0)
Immature Granulocytes: 1 %
Lymphocytes Relative: 8 %
Lymphs Abs: 1.6 10*3/uL (ref 0.7–4.0)
MCH: 32.8 pg (ref 26.0–34.0)
MCHC: 32.5 g/dL (ref 30.0–36.0)
MCV: 101 fL — ABNORMAL HIGH (ref 80.0–100.0)
Monocytes Absolute: 0.6 10*3/uL (ref 0.1–1.0)
Monocytes Relative: 3 %
Neutro Abs: 16.8 10*3/uL — ABNORMAL HIGH (ref 1.7–7.7)
Neutrophils Relative %: 87 %
Platelets: 301 10*3/uL (ref 150–400)
RBC: 4.11 MIL/uL (ref 3.87–5.11)
RDW: 11.8 % (ref 11.5–15.5)
WBC: 19.4 10*3/uL — ABNORMAL HIGH (ref 4.0–10.5)
nRBC: 0 % (ref 0.0–0.2)

## 2021-04-18 LAB — LACTIC ACID, PLASMA
Lactic Acid, Venous: 2.5 mmol/L (ref 0.5–1.9)
Lactic Acid, Venous: 0.8 mmol/L (ref 0.5–1.9)

## 2021-04-18 LAB — I-STAT BETA HCG BLOOD, ED (MC, WL, AP ONLY): I-stat hCG, quantitative: <5 m[IU]/mL (ref <5)

## 2021-04-18 LAB — ACETAMINOPHEN LEVEL: Acetaminophen (Tylenol), Serum: <10 ug/mL — ABNORMAL LOW (ref 10–30)

## 2021-04-18 LAB — RESP PANEL BY RT-PCR (FLU A&B, COVID) ARPGX2
Influenza A by PCR: NEGATIVE
Influenza B by PCR: NEGATIVE
SARS Coronavirus 2 by RT PCR: NEGATIVE

## 2021-04-18 LAB — ETHANOL: Alcohol, Ethyl (B): <10 mg/dL (ref <10)

## 2021-04-18 LAB — SALICYLATE LEVEL: Salicylate Lvl: <7 mg/dL — ABNORMAL LOW (ref 7.0–30.0)

## 2021-04-18 MED ORDER — CEFPODOXIME PROXETIL 200 MG PO TABS: 200.0000 mg | ORAL_TABLET | Freq: Two times a day (BID) | ORAL | 0 refills | Status: AC

## 2021-04-18 MED ORDER — DOXYCYCLINE HYCLATE 100 MG PO TABS
100.0000 mg | ORAL_TABLET | Freq: Once | ORAL | Status: AC
  Administered 2021-04-18: 100 mg via ORAL
  Filled 2021-04-18: qty 1

## 2021-04-18 MED ORDER — SODIUM CHLORIDE 0.9 % IV SOLN
1.0000 g | Freq: Once | INTRAVENOUS | Status: AC
  Administered 2021-04-18: 1 g via INTRAVENOUS
  Filled 2021-04-18: qty 10

## 2021-04-18 MED ORDER — DOXYCYCLINE HYCLATE 100 MG PO CAPS: 100.0000 mg | ORAL_CAPSULE | Freq: Two times a day (BID) | ORAL | 0 refills | Status: AC

## 2021-04-18 NOTE — ED Notes (Signed)
Found pt out of bed, off the monitor, with IV fluids disconnected and running on the floor. Per pt she was leaving that she needed to leave because she was witness for court and needed to leave. Provider made aware and at bedside at this time. Decision made that pt would be d/c and would wait for the paperwork

## 2021-04-18 NOTE — ED Notes (Signed)
Provider at bedside at this time

## 2021-04-18 NOTE — Discharge Instructions (Addendum)
Do not take your Clonopin today. Do not drink alcohol. Please follow-up with your family doctor for recheck. Return to the emergency department for worsening breathing or new concerning symptoms

## 2021-04-18 NOTE — ED Notes (Signed)
Found pt had removed her gown and disconnected her fluids from her PIV. Explained the importance of leaving them on. Pt provided socks and assisted to the restroom at this time to obtain a UA sample

## 2021-04-23 LAB — CULTURE, BLOOD (ROUTINE X 2)
Culture: NO GROWTH
Culture: NO GROWTH

## 2021-04-30 ENCOUNTER — Ambulatory Visit: Payer: Self-pay | Admitting: Family Medicine

## 2021-05-01 ENCOUNTER — Other Ambulatory Visit: Payer: Self-pay | Admitting: Family Medicine

## 2021-05-01 DIAGNOSIS — G894 Chronic pain syndrome: Secondary | ICD-10-CM

## 2021-05-01 DIAGNOSIS — M5416 Radiculopathy, lumbar region: Secondary | ICD-10-CM

## 2021-05-01 DIAGNOSIS — M47816 Spondylosis without myelopathy or radiculopathy, lumbar region: Secondary | ICD-10-CM

## 2021-05-06 ENCOUNTER — Other Ambulatory Visit: Payer: Self-pay | Admitting: Family Medicine

## 2021-05-06 DIAGNOSIS — G894 Chronic pain syndrome: Secondary | ICD-10-CM

## 2021-05-06 DIAGNOSIS — M5416 Radiculopathy, lumbar region: Secondary | ICD-10-CM

## 2021-05-06 DIAGNOSIS — M47816 Spondylosis without myelopathy or radiculopathy, lumbar region: Secondary | ICD-10-CM

## 2021-05-06 NOTE — Telephone Encounter (Signed)
LOV:  NOV: 06/25/2021  Pt no show her apt 04/30/2021  Also has been in the hospital several times for accidental overdose.    Last Refill:  04/02/2021 #120 0 Refills.

## 2021-06-25 ENCOUNTER — Ambulatory Visit: Payer: Self-pay | Admitting: Family Medicine

## 2021-06-25 NOTE — Progress Notes (Deleted)
      Established patient visit   Patient: Colleen Cook   DOB: [birth year]   30 y.o. Female  MRN: [id number] Visit Date: 06/25/2021  Today's healthcare provider: Mila Merry, MD   No chief complaint on file.  Subjective    HPI  ***    Medications: Outpatient Medications Prior to Visit  Medication Sig   acetaminophen (TYLENOL) 500 MG tablet Take 2 tablets (1,000 mg total) by mouth every 8 (eight) hours.   cefpodoxime (VANTIN) 200 MG tablet Take 1 tablet (200 mg total) by mouth 2 (two) times daily.   clonazePAM (KLONOPIN) 0.5 MG tablet Take 0.5 mg by mouth 2 (two) times daily as needed for anxiety.   docusate sodium (COLACE) 100 MG capsule Take 1 capsule (100 mg total) by mouth 2 (two) times daily.   doxycycline (VIBRAMYCIN) 100 MG capsule Take 1 capsule (100 mg total) by mouth 2 (two) times daily.   DULoxetine (CYMBALTA) 30 MG capsule Take 30 mg by mouth daily.   ibuprofen (ADVIL,MOTRIN) 200 MG tablet Take 800 mg by mouth every 8 (eight) hours as needed for mild pain.   levETIRAcetam (KEPPRA) 500 MG tablet Take 1 tablet (500 mg total) by mouth 2 (two) times daily.   methocarbamol (ROBAXIN) 500 MG tablet Take 2 tablets (1,000 mg total) by mouth 3 (three) times daily.   ondansetron (ZOFRAN ODT) 4 MG disintegrating tablet Take 1 tablet (4 mg total) by mouth every 8 (eight) hours as needed for nausea or vomiting.   oxyCODONE (OXY IR/ROXICODONE) 5 MG immediate release tablet Take 1 tablet (5 mg total) by mouth every 4 (four) hours as needed for severe pain.   No facility-administered medications prior to visit.    Review of Systems     Objective    There were no vitals taken for this visit.   Physical Exam  ***  No results found for any visits on 06/25/21.  Assessment & Plan     ***  No follow-ups on file.      {provider attestation***:1}   Mila Merry, MD  [facility] [phone] (phone) [phone] (fax)  [facility]

## 2021-07-09 ENCOUNTER — Encounter (HOSPITAL_COMMUNITY): Payer: Self-pay | Admitting: Emergency Medicine

## 2021-07-09 ENCOUNTER — Emergency Department (HOSPITAL_COMMUNITY)
Admission: EM | Admit: 2021-07-09 | Discharge: 2021-07-09 | Disposition: A | Discharge status: Home / Self Care | Payer: Self-pay | Attending: Emergency Medicine | Admitting: Emergency Medicine

## 2021-07-09 ENCOUNTER — Other Ambulatory Visit: Payer: Self-pay

## 2021-07-09 DIAGNOSIS — Z5321 Procedure and treatment not carried out due to patient leaving prior to being seen by health care provider: Secondary | ICD-10-CM | POA: Insufficient documentation

## 2021-07-09 DIAGNOSIS — R61 Generalized hyperhidrosis: Secondary | ICD-10-CM | POA: Insufficient documentation

## 2021-07-09 DIAGNOSIS — R109 Unspecified abdominal pain: Secondary | ICD-10-CM | POA: Insufficient documentation

## 2021-07-09 DIAGNOSIS — R6883 Chills (without fever): Secondary | ICD-10-CM | POA: Insufficient documentation

## 2021-07-09 DIAGNOSIS — J3489 Other specified disorders of nose and nasal sinuses: Secondary | ICD-10-CM | POA: Insufficient documentation

## 2021-07-09 DIAGNOSIS — H5789 Other specified disorders of eye and adnexa: Secondary | ICD-10-CM | POA: Insufficient documentation

## 2021-07-09 DIAGNOSIS — G8929 Other chronic pain: Secondary | ICD-10-CM | POA: Insufficient documentation

## 2021-07-09 DIAGNOSIS — F1123 Opioid dependence with withdrawal: Secondary | ICD-10-CM | POA: Insufficient documentation

## 2021-07-09 DIAGNOSIS — M545 Low back pain, unspecified: Secondary | ICD-10-CM | POA: Insufficient documentation

## 2021-07-09 DIAGNOSIS — Z20822 Contact with and (suspected) exposure to covid-19: Secondary | ICD-10-CM | POA: Insufficient documentation

## 2021-07-09 LAB — RESP PANEL BY RT-PCR (FLU A&B, COVID) ARPGX2
Influenza A by PCR: NEGATIVE
Influenza B by PCR: NEGATIVE
SARS Coronavirus 2 by RT PCR: NEGATIVE

## 2021-07-09 LAB — COMPREHENSIVE METABOLIC PANEL
ALT: 14 U/L (ref 0–44)
AST: 16 U/L (ref 15–41)
Albumin: 3.3 g/dL — ABNORMAL LOW (ref 3.5–5.0)
Alkaline Phosphatase: 36 U/L — ABNORMAL LOW (ref 38–126)
Anion gap: 6 (ref 5–15)
BUN: 9 mg/dL (ref 6–20)
CO2: 28 mmol/L (ref 22–32)
Calcium: 9 mg/dL (ref 8.9–10.3)
Chloride: 102 mmol/L (ref 98–111)
Creatinine, Ser: 0.79 mg/dL (ref 0.44–1.00)
GFR, Estimated: >60 mL/min (ref >60)
Glucose, Bld: 101 mg/dL — ABNORMAL HIGH (ref 70–99)
Potassium: 4 mmol/L (ref 3.5–5.1)
Sodium: 136 mmol/L (ref 135–145)
Total Bilirubin: 0.6 mg/dL (ref 0.3–1.2)
Total Protein: 5.9 g/dL — ABNORMAL LOW (ref 6.5–8.1)

## 2021-07-09 LAB — CBC WITH DIFFERENTIAL/PLATELET
Abs Immature Granulocytes: 0.01 10*3/uL (ref 0.00–0.07)
Basophils Absolute: 0.1 10*3/uL (ref 0.0–0.1)
Basophils Relative: 1 %
Eosinophils Absolute: 0.2 10*3/uL (ref 0.0–0.5)
Eosinophils Relative: 3 %
HCT: 36 % (ref 36.0–46.0)
Hemoglobin: 11.5 g/dL — ABNORMAL LOW (ref 12.0–15.0)
Immature Granulocytes: 0 %
Lymphocytes Relative: 34 %
Lymphs Abs: 2.3 10*3/uL (ref 0.7–4.0)
MCH: 30.3 pg (ref 26.0–34.0)
MCHC: 31.9 g/dL (ref 30.0–36.0)
MCV: 94.7 fL (ref 80.0–100.0)
Monocytes Absolute: 0.5 10*3/uL (ref 0.1–1.0)
Monocytes Relative: 8 %
Neutro Abs: 3.7 10*3/uL (ref 1.7–7.7)
Neutrophils Relative %: 54 %
Platelets: 276 10*3/uL (ref 150–400)
RBC: 3.8 MIL/uL — ABNORMAL LOW (ref 3.87–5.11)
RDW: 11.4 % — ABNORMAL LOW (ref 11.5–15.5)
WBC: 6.8 10*3/uL (ref 4.0–10.5)
nRBC: 0 % (ref 0.0–0.2)

## 2021-07-09 LAB — ETHANOL: Alcohol, Ethyl (B): <10 mg/dL (ref <10)

## 2021-07-09 LAB — I-STAT BETA HCG BLOOD, ED (MC, WL, AP ONLY): I-stat hCG, quantitative: <5 m[IU]/mL (ref <5)

## 2021-07-09 LAB — HCG, QUANTITATIVE, PREGNANCY: hCG, Beta Chain, Quant, S: <1 m[IU]/mL (ref <5)

## 2021-07-09 NOTE — ED Notes (Signed)
PT called for room 3x w/o response.

## 2021-07-09 NOTE — ED Triage Notes (Signed)
Pt states she is detoxing from opioids.   States she last used this morning.  Reports runny nose, watery eyes, chills, sweats, abd cramping, and chronic back pain.  Denies SI/HI.

## 2021-07-09 NOTE — ED Provider Notes (Signed)
The patient left prior to being seen by me in the Main ED.   Achille Rich, PA-C 07/09/21 1648    Maia Plan, MD 07/15/21 1025

## 2021-07-09 NOTE — ED Notes (Signed)
Pt walked off unit for food despite being told not to consume any food/fluids

## 2021-07-09 NOTE — ED Provider Notes (Signed)
Emergency Medicine Provider Triage Evaluation Note  Colleen Cook , a 30 y.o. female  was evaluated in triage.  Pt complains of heroin withdrawal.  Patient reports he did use heroin the past few years.  Denies any IV drug use, she reports she snorts the drug.  She reports she is having "the sweats" and chills.  Last use this morning.  She is seeking detox from the drug.  Patient reports she tried to detox from the drug last month and had a seizure.  Review of Systems  Positive: Diaphoresis/chills Negative: Chest pain, shortness of breath, dizziness, lightheadedness, seizures  Physical Exam  BP 119/74 (BP Location: Left Arm)   Pulse (!) 104   Temp 98.2 F (36.8 C)   Resp 16   LMP 07/05/2021   SpO2 96%  Gen:   Awake, no distress   Resp:  Normal effort  MSK:   Moves extremities without difficulty  Other:  Slow to speak, flat affect  Medical Decision Making  Medically screening exam initiated at 10:26 AM.  Appropriate orders placed.  Chantale Leugers [place] was informed that the remainder of the evaluation will be completed by another provider, this initial triage assessment does not replace that evaluation, and the importance of remaining in the ED until their evaluation is complete.  Medical clearance order set placed   Achille Rich, Cordelia Poche 07/09/21 1034    Gerhard Munch, MD 07/10/21 1700

## 2021-11-15 IMAGING — DX DG CHEST 1V PORT
2 series · 2 of 2 positions shown · non-contrast
Comparison: 02/12/2021

CLINICAL DATA: Unresponsive

EXAM:
PORTABLE CHEST 1 VIEW

[chest ap (1 of 2)]
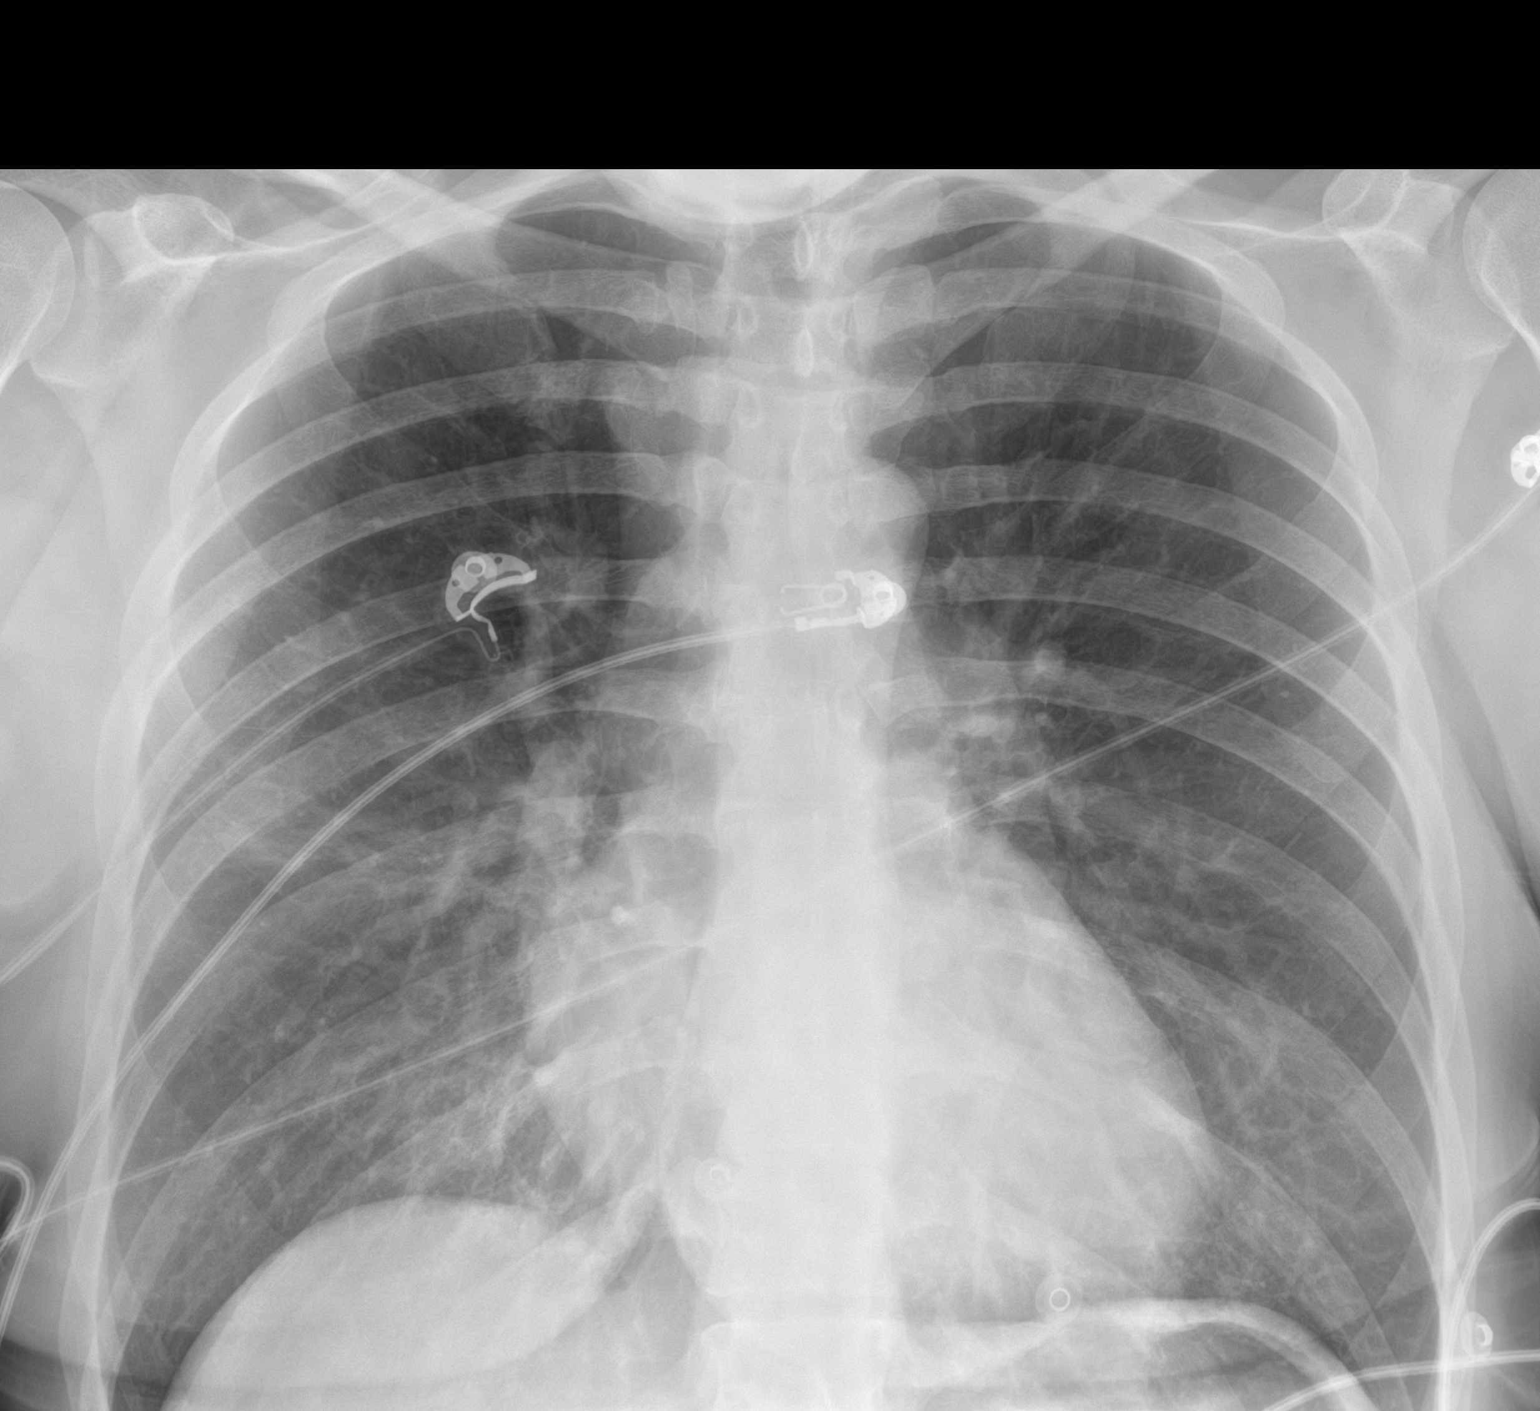

[chest ap (2 of 2)]
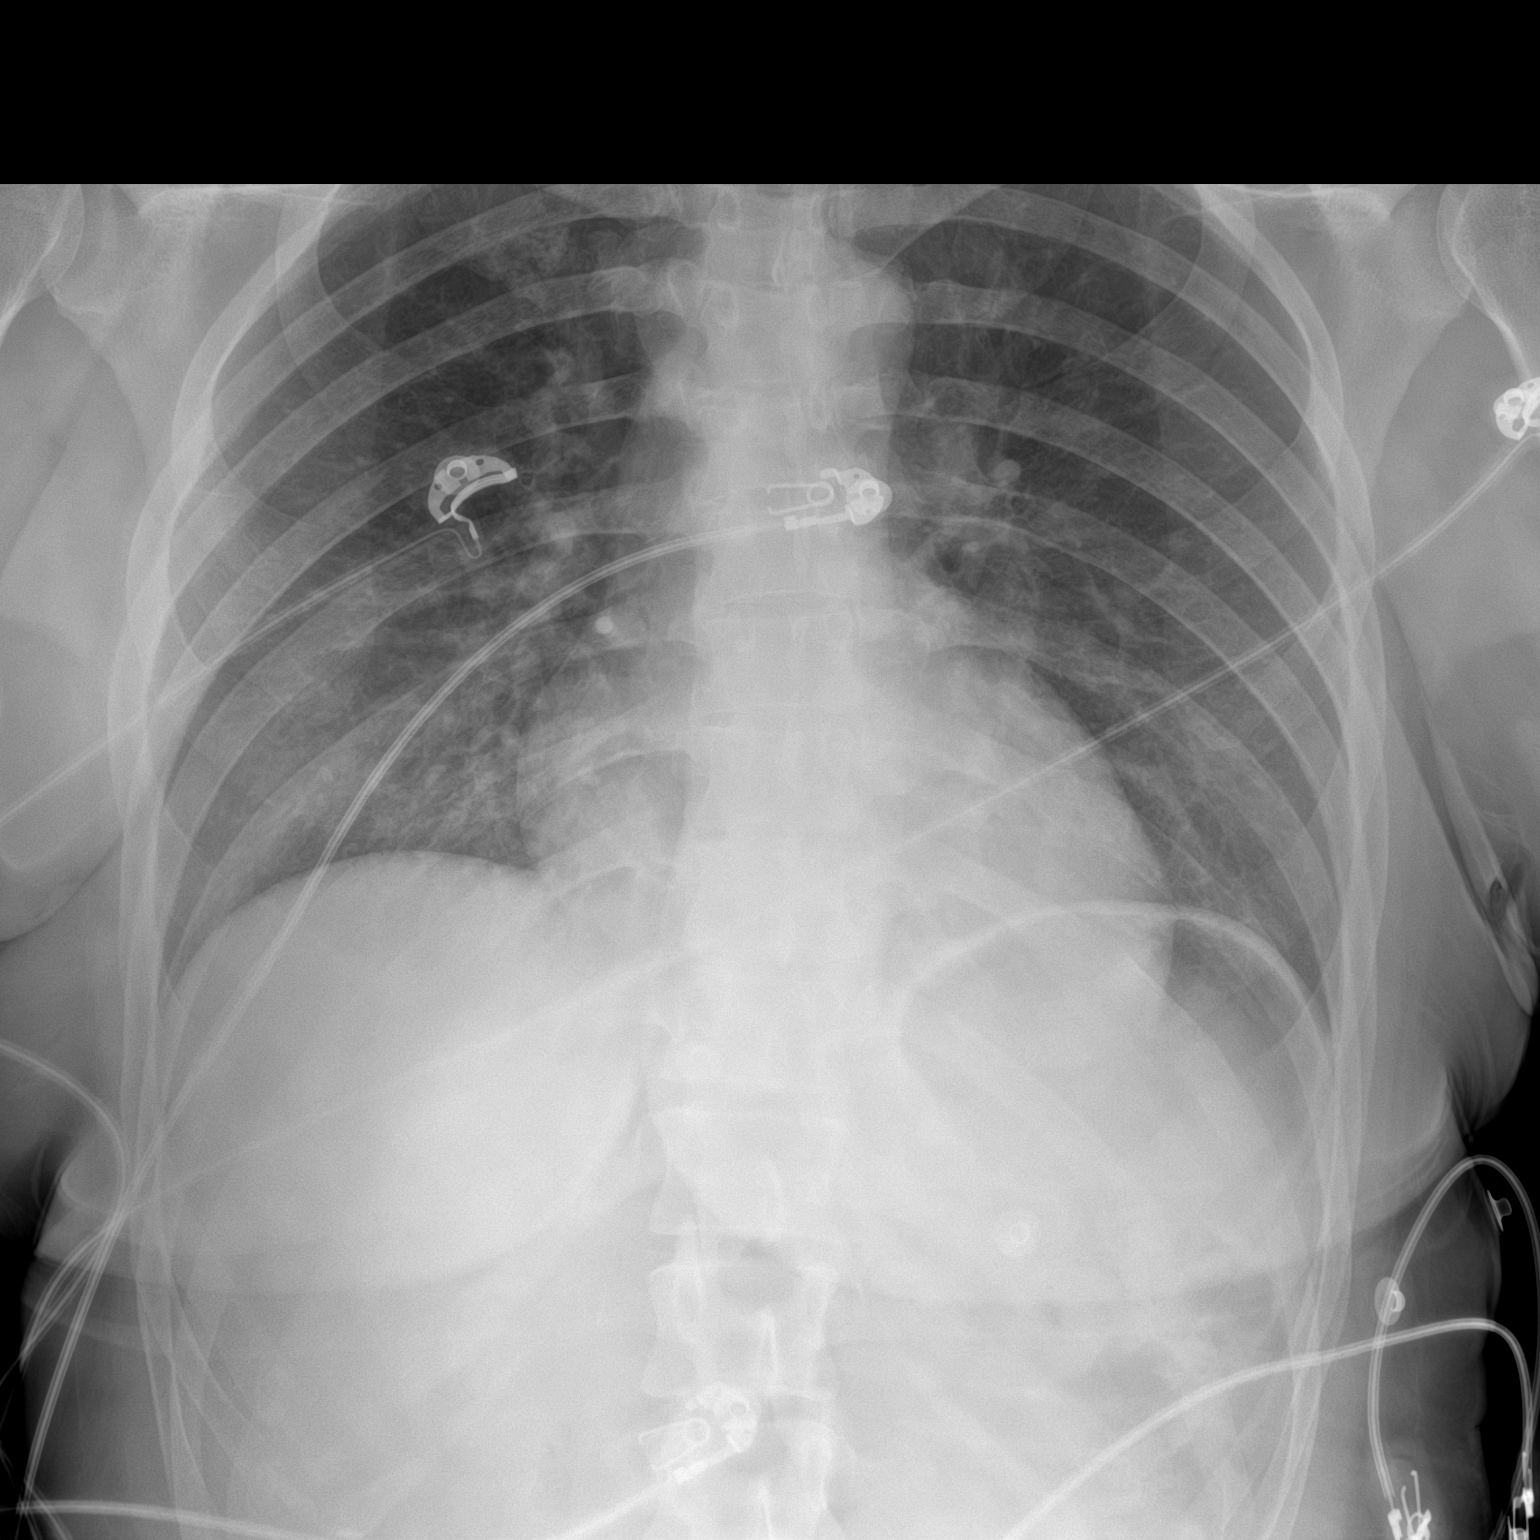

[2 of 2 positions shown; findings below may reference images not displayed]

FINDINGS: Hazy right basilar opacity.  Normal heart size.  No pneumothorax.
IMPRESSION: Hazy right basilar opacity may reflect atelectasis, pneumonia, or
aspiration.

## 2022-10-20 ENCOUNTER — Telehealth: Payer: Self-pay

## 2022-10-20 NOTE — Transitions of Care (Post Inpatient/ED Visit) (Signed)
   10/20/2022  Name: Colleen Cook MRN: [id number] DOB: 29-Nov-1990  Today's TOC FU Call Status: Today's TOC FU Call Status:: Unsuccessul Call (1st Attempt) Unsuccessful Call (1st Attempt) Date: 10/20/22  Attempted to reach the patient regarding the most recent Inpatient/ED visit.  Follow Up Plan: Additional outreach attempts will be made to reach the patient to complete the Transitions of Care (Post Inpatient/ED visit) call.   Signature [employer]
